# Patient Record
Sex: Male | Born: 1970 | Race: White | Hispanic: Refuse to answer | Marital: Married | Smoking: Never smoker
Health system: Southern US, Community
[De-identification: ages and names within clinical notes are randomized; demographics above are authoritative.]

## PROBLEM LIST (undated history)

## (undated) DIAGNOSIS — K8020 Calculus of gallbladder without cholecystitis without obstruction: Secondary | ICD-10-CM

## (undated) DIAGNOSIS — Z789 Other specified health status: Secondary | ICD-10-CM

## (undated) HISTORY — PX: VASECTOMY: SHX75

---

## 2015-02-08 ENCOUNTER — Ambulatory Visit (INDEPENDENT_AMBULATORY_CARE_PROVIDER_SITE_OTHER): Payer: BC Managed Care – PPO | Admitting: Physician Assistant

## 2015-02-08 VITALS — BP 128/86 | HR 50 | Temp 98.5°F | Resp 16 | Ht 65.75 in | Wt 164.0 lb

## 2015-02-08 DIAGNOSIS — Z23 Encounter for immunization: Secondary | ICD-10-CM

## 2015-02-08 DIAGNOSIS — H01116 Allergic dermatitis of left eye, unspecified eyelid: Secondary | ICD-10-CM | POA: Diagnosis not present

## 2015-02-08 NOTE — Progress Notes (Signed)
[facility] [address], [place] [id number] [phone]  Date:  02/08/2015   Name:  Steven Miles   DOB:  February 24, 1971   MRN:  [id number]  PCP:  Virgilio BellingWEBER,SARAH, PA-C    Chief Complaint: Belepharitis and Rash   History of Present Illness:  This is a 44 y.o. male who is presenting with left upper eyelid swelling x 3 days. Got worse last night but seems to be a little better today. The eyelid is swollen, red and itchy. No change in vision, no eyeball pain, irritation or redness, no photophobia, no drainage. Prior to symptoms beginning he was doing a lot of work outside and in [employer] under home. He does not have any known allergies. His wife put a small amount of neosporin on upper lid last night, otherwise he has not tried anything else for his symptoms.  Review of Systems:  Review of Systems See HPI  There are no active problems to display for this patient.   Prior to Admission medications   Not on File    No Known Allergies  History reviewed. No pertinent past surgical history.  Social History  Substance Use Topics  . Smoking status: Never Smoker   . Smokeless tobacco: None  . Alcohol Use: No    Family History  Problem Relation Age of Onset  . Diabetes Father   . Hyperlipidemia Brother     Medication list has been reviewed and updated.  Physical Examination:  Physical Exam  Constitutional: He is oriented to person, place, and time. He appears well-developed and well-nourished. No distress.  HENT:  Head: Normocephalic and atraumatic.  Right Ear: Hearing normal.  Left Ear: Hearing normal.  Nose: Nose normal.  Mouth/Throat: Uvula is midline, oropharynx is clear and moist and mucous membranes are normal.  Eyes: Conjunctivae and EOM are normal. Pupils are equal, round, and reactive to light. Right eye exhibits no discharge. Left eye exhibits no discharge. No scleral icterus.  Left upper lid puffy and erythematous, appears slightly dry at  medial lid. No skin crusting or flaking. No discharge. Not tender.  Pulmonary/Chest: Effort normal. No respiratory distress.  Musculoskeletal: Normal range of motion.  Lymphadenopathy:    He has no cervical adenopathy.  Neurological: He is alert and oriented to person, place, and time.  Skin: Skin is warm, dry and intact.  Psychiatric: He has a normal mood and affect. His speech is normal and behavior is normal. Thought content normal.   BP 128/86 mmHg  Pulse 50  Temp(Src) 98.5 F (36.9 C) (Oral)  Resp 16  Ht 5' 5.75" (1.67 m)  Wt 164 lb (74.39 kg)  BMI 26.67 kg/m2  SpO2 98%   Visual Acuity Screening   Right eye Left eye Both eyes  Without correction: 20/10 20/10 20/10   With correction:      Assessment and Plan:  1. Eyelid dermatitis, allergic/contact, left Suspect contact dermatitis of left lid d/t pruritis and lack of drainage, red eye, pain, eyeball irritation. Less likely blepharitis. He will treat with warm compresses and daily zyrtec. Return in 1 week if sx not improved or at any time if sx worsen.  2. Needs flu shot - Flu Vaccine QUAD 36+ mos IM   Roswell MinersNicole V. Dyke BrackettBush, PA-C, MHS [facility]  02/08/2015

## 2015-02-08 NOTE — Patient Instructions (Signed)
This is likely an allergic reaction. It could also be inflammation of the eyelid called blepharitis. To treat, apply warm compresses and take zyrtec daily until resolved. Avoid putting any ointments on eye. If your actual eyeball gets irritated, you may put rewetting or lubricating eyedrops in. Return in 1 week if your symptoms do not improve or at any time if symptoms worsen.

## 2015-11-18 ENCOUNTER — Ambulatory Visit (INDEPENDENT_AMBULATORY_CARE_PROVIDER_SITE_OTHER): Payer: BC Managed Care – PPO | Admitting: Family Medicine

## 2015-11-18 VITALS — BP 118/76 | HR 52 | Temp 97.7°F | Resp 18 | Ht 65.75 in | Wt 167.8 lb

## 2015-11-18 DIAGNOSIS — Z0000 Encounter for general adult medical examination without abnormal findings: Secondary | ICD-10-CM | POA: Diagnosis not present

## 2015-11-18 LAB — CBC
HEMATOCRIT: 45.1 % (ref 38.5–50.0)
HEMOGLOBIN: 15.5 g/dL (ref 13.2–17.1)
MCH: 30.7 pg (ref 27.0–33.0)
MCHC: 34.4 g/dL (ref 32.0–36.0)
MCV: 89.3 fL (ref 80.0–100.0)
MPV: 9.4 fL (ref 7.5–12.5)
Platelets: 270 10*3/uL (ref 140–400)
RBC: 5.05 MIL/uL (ref 4.20–5.80)
RDW: 13.6 % (ref 11.0–15.0)
WBC: 5.8 10*3/uL (ref 3.8–10.8)

## 2015-11-18 LAB — LIPID PANEL
CHOL/HDL RATIO: 3.6 ratio (ref <=5.0)
Cholesterol: 166 mg/dL (ref 125–200)
HDL: 46 mg/dL (ref >=40)
LDL CALC: 89 mg/dL (ref <130)
Triglycerides: 157 mg/dL — ABNORMAL HIGH (ref <150)
VLDL: 31 mg/dL — AB (ref <30)

## 2015-11-18 LAB — COMPREHENSIVE METABOLIC PANEL
ALT: 31 U/L (ref 9–46)
AST: 26 U/L (ref 10–40)
Albumin: 4.5 g/dL (ref 3.6–5.1)
Alkaline Phosphatase: 78 U/L (ref 40–115)
BUN: 18 mg/dL (ref 7–25)
CALCIUM: 9.4 mg/dL (ref 8.6–10.3)
CO2: 26 mmol/L (ref 20–31)
Chloride: 106 mmol/L (ref 98–110)
Creat: 1.16 mg/dL (ref 0.60–1.35)
GLUCOSE: 98 mg/dL (ref 65–99)
POTASSIUM: 4.2 mmol/L (ref 3.5–5.3)
Sodium: 142 mmol/L (ref 135–146)
Total Bilirubin: 0.6 mg/dL (ref 0.2–1.2)
Total Protein: 7.4 g/dL (ref 6.1–8.1)

## 2015-11-18 NOTE — Progress Notes (Signed)
Subjective:    Steven BrunsKevin Miles is a 45 y.o. 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  [place] and his church [place].  He lives a very active lifestyle.  He previously competed regularly in traithlons including distances of up to half-ironman.  This eventually did not become fun for him anymore, and now he works out and runs and exercises with his 44 year old daughter 5 days a week.    States he eats red meat.  Drinks "lots" of sweet tea.  Otherwise eats healthy diet.    Has no real past medical history to speak of.  No surgeries.  No hospitalizations.  Father with Type 2 diabetes, but otherwise denies any family medical history.  The patient denies fever, unusual weight change, decreased hearing, chest pain, palpitations, pre-syncopal or syncopal episodes, dyspnea on exertion, prolonged cough, hemoptysis, change in bowel habits, melena, hematochezia, severe indigestion/heartburn, nausea/vomiting/abdominal pain, genital sores, muscle weakness, difficulty walking, abnormal bleeding, or enlarged lymph nodes.     Objective:   Physical Exam BP 118/76   Pulse (!) 52   Temp 97.7 F (36.5 C) (Oral)   Resp 18   Ht 5' 5.75" (1.67 m)   Wt 167 lb 12.8 oz (76.1 kg)   SpO2 100%   BMI 27.29 kg/m  HEENT:  [place]/AT.  EOMI, PERRL.  MMM, tonsils non-erythematous, non-edematous.  External ears WNL, Bilateral TM's normal without retraction, redness or bulging.  Neck: Supple Lungs: Clear throughout Heart: Regular rate and rhythm without murmur. Within normal limits. Abdomen: Soft/NT/ND.  No organomegaly.  Musculoskeletal and spine exam: Within normal limits.  Strength is 5/5 Neuro:  Alert and oriented to person, place, and date.  CN II-XII intact.  No focal deficits noted.   Skin: Within normal limits Psych:  Not depressed or anxious appearing.  Linear and coherent thought process as evidenced by speech pattern. Smiles spontaneously.     Impression/Plan: 1.  Complete physical: - doing very well.   - no concerns.  - FU in 1 year.  Sooner if issues arise.   - we discussed DRE and PSA testing.  Patient declined either of these today.  No family history of prostate disease/cancer.

## 2015-11-18 NOTE — Patient Instructions (Addendum)
It was great to meet you today.  Tell your wife you're doing fantastic  Keep up what you have been doing as far as exercise.  We're checking basic labwork today and will let you know the results.    IF you received an x-ray today, you will receive an invoice from [facility]. Please contact [facility] at [phone] with questions or concerns regarding your invoice.   IF you received labwork today, you will receive an invoice from [facility]. Please contact [facility] at [phone] with questions or concerns regarding your invoice.   Our billing staff will not be able to assist you with questions regarding bills from these companies.  You will be contacted with the lab results as soon as they are available. The fastest way to get your results is to activate your My Chart account. Instructions are located on the last page of this paperwork. If you have not heard from us regarding the results in 2 weeks, please contact this office.

## 2015-11-19 ENCOUNTER — Encounter: Payer: Self-pay | Admitting: Family Medicine

## 2015-12-30 ENCOUNTER — Ambulatory Visit (INDEPENDENT_AMBULATORY_CARE_PROVIDER_SITE_OTHER): Payer: BC Managed Care – PPO | Admitting: Urgent Care

## 2015-12-30 ENCOUNTER — Encounter: Payer: Self-pay | Admitting: Physician Assistant

## 2015-12-30 VITALS — BP 129/88 | HR 60 | Ht 65.0 in | Wt 167.8 lb

## 2015-12-30 DIAGNOSIS — S0101XA Laceration without foreign body of scalp, initial encounter: Secondary | ICD-10-CM | POA: Diagnosis not present

## 2015-12-30 DIAGNOSIS — R51 Headache: Secondary | ICD-10-CM

## 2015-12-30 DIAGNOSIS — R519 Headache, unspecified: Secondary | ICD-10-CM

## 2015-12-30 DIAGNOSIS — S0990XA Unspecified injury of head, initial encounter: Secondary | ICD-10-CM

## 2015-12-30 MED ORDER — ALPRAZOLAM 0.25 MG PO TABS
0.5000 mg | ORAL_TABLET | Freq: Once | ORAL | Status: AC
  Administered 2015-12-30: 0.5 mg via ORAL

## 2015-12-30 MED ORDER — ACETAMINOPHEN 500 MG PO TABS
1000.0000 mg | ORAL_TABLET | Freq: Once | ORAL | Status: AC
  Administered 2015-12-30: 1000 mg via ORAL

## 2015-12-30 NOTE — Progress Notes (Addendum)
° °  By signing my name below, I, Steven Miles, attest that this documentation has been prepared under the direction and in the presence of Wallis BambergMario Mani, [place]-C.  Electronically Signed: Arvilla MarketMesha Miles, Medical Scribe. 12/30/15. 11:54 AM.  MRN: [id number] DOB: April 01, 1970  Subjective:   Barkley BrunsKevin Lora is a 45 y.o. male presenting for chief complaint of head laceration onset today. Pt was fishing when a loose tree limb fell on his head. Denies loss of consciousness, weakness, numbness or tingling, confusion. Admits a headache, feeling queezy. He bled profusely and used a dirty rag to apply pressure. Last Tdap was 07/08/2009.  [name] is not currently taking any medications.  Also has No Known Allergies.  [name]  has no past medical history on file. Also  has no past surgical history on file.  Objective:   Vitals: BP 129/88 (BP Location: Right Arm, Patient Position: Sitting, Cuff Size: Normal)    Pulse 60    Ht 5\' 5"  (1.651 m)    Wt 167 lb 12.8 oz (76.1 kg)    BMI 27.92 kg/m   Physical Exam  Constitutional: He is oriented to person, place, and time. He appears well-developed and well-nourished.  HENT:  TM's intact bilaterally, no effusions or erythema. Nasal turbinates pink and moist, nasal passages patent. No sinus tenderness. Oropharynx clear, mucous membranes moist, dentition in good repair.  Eyes: EOM are normal. Pupils are equal, round, and reactive to light.  Neck: Normal range of motion. Neck supple.  Cardiovascular: Normal rate.   Pulmonary/Chest: Effort normal.  Neurological: He is alert and oriented to person, place, and time. He has normal strength and normal reflexes. No cranial nerve deficit. Coordination normal.  Strength intact- 5/5  Skin: Skin is warm and dry.   PROCEDURE NOTE: laceration repair Verbal consent obtained from patient.  Local anesthesia with 5cc Lidocaine 2% with epinephrine.  Wound explored for tendon, ligament damage. Wound scrubbed with soap and water and rinsed.  Wound closed with #16 4-0 Prolene (4 horizontal mattress, 12 simple interrupted) sutures.  Wound cleansed and dressed.   Assessment and Plan :   1. Laceration of scalp, initial encounter 2. Pain of scalp 3. Injury of head, initial encounter - Wound care reviewed, patient to rtc in 8-10 days, counseled on signs and symptom of post-concussive syndrome. Physical exam findings are very reassuring.  Wallis BambergMario Mani, PA-C [facility] [phone] 12/30/2015 11:51 AM

## 2015-12-30 NOTE — Progress Notes (Signed)
Pt was out fishing today when a tree limb fell directly to right scalp area.  Approximately 10cm laceration with min bleeding C/o slight headache and nausea vss

## 2015-12-30 NOTE — Patient Instructions (Addendum)
For pain, you may take ibuprofen 400mg  and alternate with Tylenol 500mg  every 6 hours.   Laceration Care, Adult A laceration is a cut that goes through all of the layers of the skin and into the tissue that is right under the skin. Some lacerations heal on their own. Others need to be closed with stitches (sutures), staples, skin adhesive strips, or skin glue. Proper laceration care minimizes the risk of infection and helps the laceration to heal better. HOW TO CARE FOR YOUR LACERATION If sutures or staples were used:  Keep the wound clean and dry.  If you were given a bandage (dressing), you should change it at least one time per day or as told by your health care provider. You should also change it if it becomes wet or dirty.  Keep the wound completely dry for the first 24 hours or as told by your health care provider. After that time, you may shower or bathe. However, make sure that the wound is not soaked in water until after the sutures or staples have been removed.  Clean the wound one time each day or as told by your health care provider:  Wash the wound with soap and water.  Rinse the wound with water to remove all soap.  Pat the wound dry with a clean towel. Do not rub the wound.  After cleaning the wound, apply a thin layer of antibiotic ointmentas told by your health care provider. This will help to prevent infection and keep the dressing from sticking to the wound.  Have the sutures or staples removed as told by your health care provider. If skin adhesive strips were used:  Keep the wound clean and dry.  If you were given a bandage (dressing), you should change it at least one time per day or as told by your health care provider. You should also change it if it becomes dirty or wet.  Do not get the skin adhesive strips wet. You may shower or bathe, but be careful to keep the wound dry.  If the wound gets wet, pat it dry with a clean towel. Do not rub the wound.  Skin  adhesive strips fall off on their own. You may trim the strips as the wound heals. Do not remove skin adhesive strips that are still stuck to the wound. They will fall off in time. If skin glue was used:  Try to keep the wound dry, but you may briefly wet it in the shower or bath. Do not soak the wound in water, such as by swimming.  After you have showered or bathed, gently pat the wound dry with a clean towel. Do not rub the wound.  Do not do any activities that will make you sweat heavily until the skin glue has fallen off on its own.  Do not apply liquid, cream, or ointment medicine to the wound while the skin glue is in place. Using those may loosen the film before the wound has healed.  If you were given a bandage (dressing), you should change it at least one time per day or as told by your health care provider. You should also change it if it becomes dirty or wet.  If a dressing is placed over the wound, be careful not to apply tape directly over the skin glue. Doing that may cause the glue to be pulled off before the wound has healed.  Do not pick at the glue. The skin glue usually remains in place  for 5-10 days, then it falls off of the skin. General Instructions  Take over-the-counter and prescription medicines only as told by your health care provider.  If you were prescribed an antibiotic medicine or ointment, take or apply it as told by your doctor. Do not stop using it even if your condition improves.  To help prevent scarring, make sure to cover your wound with sunscreen whenever you are outside after stitches are removed, after adhesive strips are removed, or when glue remains in place and the wound is healed. Make sure to wear a sunscreen of at least 30 SPF.  Do not scratch or pick at the wound.  Keep all follow-up visits as told by your health care provider. This is important.  Check your wound every day for signs of infection. Watch for:  Redness, swelling, or  pain.  Fluid, blood, or pus.  Raise (elevate) the injured area above the level of your heart while you are sitting or lying down, if possible. SEEK MEDICAL CARE IF:  You received a tetanus shot and you have swelling, severe pain, redness, or bleeding at the injection site.  You have a fever.  A wound that was closed breaks open.  You notice a bad smell coming from your wound or your dressing.  You notice something coming out of the wound, such as wood or glass.  Your pain is not controlled with medicine.  You have increased redness, swelling, or pain at the site of your wound.  You have fluid, blood, or pus coming from your wound.  You notice a change in the color of your skin near your wound.  You need to change the dressing frequently due to fluid, blood, or pus draining from the wound.  You develop a new rash.  You develop numbness around the wound. SEEK IMMEDIATE MEDICAL CARE IF:  You develop severe swelling around the wound.  Your pain suddenly increases and is severe.  You develop painful lumps near the wound or on skin that is anywhere on your body.  You have a red streak going away from your wound.  The wound is on your hand or foot and you cannot properly move a finger or toe.  The wound is on your hand or foot and you notice that your fingers or toes look pale or bluish.   This information is not intended to replace advice given to you by your health care provider. Make sure you discuss any questions you have with your health care provider.   Document Released: 03/13/2005 Document Revised: 07/28/2014 Document Reviewed: 03/09/2014 Elsevier Interactive Patient Education [employer]2016 Elsevier Inc.

## 2016-01-10 ENCOUNTER — Ambulatory Visit (INDEPENDENT_AMBULATORY_CARE_PROVIDER_SITE_OTHER): Payer: BC Managed Care – PPO | Admitting: Physician Assistant

## 2016-01-10 DIAGNOSIS — S0101XD Laceration without foreign body of scalp, subsequent encounter: Secondary | ICD-10-CM

## 2016-01-10 NOTE — Patient Instructions (Addendum)
Keep away from large branches while fishing today.   Return if you have any fever, bleeding, pus drainage.   Thank you for letting me participate in your health and well being.      IF you received an x-ray today, you will receive an invoice from [facility]. Please contact [facility] at [phone] with questions or concerns regarding your invoice.   IF you received labwork today, you will receive an invoice from [facility]. Please contact [facility] at [phone] with questions or concerns regarding your invoice.   Our billing staff will not be able to assist you with questions regarding bills from these companies.  You will be contacted with the lab results as soon as they are available. The fastest way to get your results is to activate your My Chart account. Instructions are located on the last page of this paperwork. If you have not heard from us regarding the results in 2 weeks, please contact this office.

## 2016-01-10 NOTE — Progress Notes (Signed)
   Patient: Steven Miles [id number]  Subjective: Steven Miles is returning for suture removal. Patient was initially seen 12/30/15 and had 17 sutures placed. Denies fever, drainage of pus or blood, wound dehiscence, edema, pain.   Objective: Physical Exam  Constitutional: He is oriented to person, place, and time and well-developed, well-nourished, and in no distress.  HENT:  Head: Normocephalic and atraumatic.    Eyes: Conjunctivae are normal.  Neck: Normal range of motion.  Pulmonary/Chest: Effort normal.  Musculoskeletal: Normal range of motion.  Neurological: He is alert and oriented to person, place, and time. Gait normal.  Skin: Skin is warm and dry.  Psychiatric: Affect normal.  Vitals reviewed.  # 17 sutures removed without incident. Patient tolerated this well.  Assessment and Plan: 1. Laceration of scalp, subsequent encounter Well-healed wound. Anticipatory guidance provided. Return to clinic as needed.  Benjiman CoreBrittany Aspyn Warnke, PA-C  [facility] 01/10/2016 8:54 AM

## 2016-06-30 ENCOUNTER — Ambulatory Visit (INDEPENDENT_AMBULATORY_CARE_PROVIDER_SITE_OTHER): Payer: BC Managed Care – PPO | Admitting: Physician Assistant

## 2016-06-30 VITALS — BP 132/86 | HR 84 | Temp 98.3°F | Resp 18 | Ht 65.0 in | Wt 170.0 lb

## 2016-06-30 DIAGNOSIS — L282 Other prurigo: Secondary | ICD-10-CM | POA: Diagnosis not present

## 2016-06-30 DIAGNOSIS — W57XXXA Bitten or stung by nonvenomous insect and other nonvenomous arthropods, initial encounter: Secondary | ICD-10-CM

## 2016-06-30 MED ORDER — CLOBETASOL PROPIONATE 0.05 % EX OINT: 1.0000 "application " | TOPICAL_OINTMENT | Freq: Two times a day (BID) | CUTANEOUS | 0 refills | Status: DC

## 2016-06-30 MED ORDER — DOXYCYCLINE HYCLATE 100 MG PO CAPS: 100.0000 mg | ORAL_CAPSULE | Freq: Two times a day (BID) | ORAL | 0 refills | Status: AC

## 2016-06-30 NOTE — Patient Instructions (Signed)
     IF you received an x-ray today, you will receive an invoice from [facility]. Please contact [facility] at [phone] with questions or concerns regarding your invoice.   IF you received labwork today, you will receive an invoice from [facility]. Please contact [facility] at [phone] with questions or concerns regarding your invoice.   Our billing staff will not be able to assist you with questions regarding bills from these companies.  You will be contacted with the lab results as soon as they are available. The fastest way to get your results is to activate your My Chart account. Instructions are located on the last page of this paperwork. If you have not heard from us regarding the results in 2 weeks, please contact this office.     

## 2016-06-30 NOTE — Progress Notes (Signed)
  07/03/2016 9:52 AM   DOB: 29-Nov-1970 / MRN: [id number]  SUBJECTIVE:  Steven Miles is a 46 y.o. male presenting for a tick bite that he noticed on his right inguinal canal about 2 days ago. Denies fever, chills, significant rashes, arthralgia.  He has the tick with him today. Tells me his TD is up to date.   He has No Known Allergies.    Review of Systems  Constitutional: Negative for chills, diaphoresis and fever.  Eyes: Negative.   Respiratory: Negative for cough, hemoptysis, sputum production, shortness of breath and wheezing.   Cardiovascular: Negative for chest pain, orthopnea and leg swelling.  Gastrointestinal: Negative for abdominal pain, blood in stool, constipation, diarrhea, heartburn, melena, nausea and vomiting.  Genitourinary: Negative for flank pain.  Skin: Negative for rash.  Neurological: Negative for dizziness, sensory change, speech change, focal weakness and headaches.    The problem list and medications were reviewed and updated by myself where necessary and exist elsewhere in the encounter.   OBJECTIVE:  BP 132/86   Pulse 84   Temp 98.3 F (36.8 C) (Oral)   Resp 18   Ht  (1.651 m)   Wt 170 lb (77.1 kg)   SpO2 94%   BMI 28.29 kg/m   Physical Exam  Constitutional: He appears well-developed. He is active and cooperative.  Non-toxic appearance.  Cardiovascular: Normal rate, regular rhythm, S1 normal, S2 normal, normal heart sounds, intact distal pulses and normal pulses.  Exam reveals no gallop and no friction rub.   No murmur heard. Pulmonary/Chest: Effort normal. No tachypnea. He has no rales.  Abdominal: He exhibits no distension.  Musculoskeletal: He exhibits no edema.  Neurological: He is alert.  Skin: Skin is warm and dry. He is not diaphoretic. No pallor.     Vitals reviewed.    No results found for this or any previous visit (from the past 72 hour(s)).  No results found.  ASSESSMENT AND PLAN:  Steven Miles was seen today for tick  bite.  Diagnoses and all orders for this visit:  Pruritic rash: See problem 2.  -     clobetasol ointment (TEMOVATE) 0.05 %; Apply 1 application topically 2 (two) times daily.  Tick bite, initial encounter: Low risk as this appears to be an [place] dog tic and he has no concerning rash. He will fill doxy per the sig and RTC as needed.  -     doxycycline (VIBRAMYCIN) 100 MG capsule; Take 1 capsule (100 mg total) by mouth 2 (two) times daily. Fill only if fever or chills or muscle aches or joint pain or rash.    The patient is advised to call or return to clinic if he does not see an improvement in symptoms, or to seek the care of the closest emergency department if he worsens with the above plan.   Deliah Boston, MHS, PA-C [facility] 07/03/2016 9:52 AM

## 2016-07-13 ENCOUNTER — Ambulatory Visit (INDEPENDENT_AMBULATORY_CARE_PROVIDER_SITE_OTHER): Payer: BC Managed Care – PPO | Admitting: Physician Assistant

## 2016-07-13 VITALS — BP 149/89 | HR 59 | Temp 97.8°F | Resp 18 | Ht 65.0 in | Wt 173.4 lb

## 2016-07-13 DIAGNOSIS — S30861A Insect bite (nonvenomous) of abdominal wall, initial encounter: Secondary | ICD-10-CM | POA: Diagnosis not present

## 2016-07-13 DIAGNOSIS — W57XXXA Bitten or stung by nonvenomous insect and other nonvenomous arthropods, initial encounter: Secondary | ICD-10-CM

## 2016-07-13 NOTE — Patient Instructions (Addendum)
If you have fever, chills, muscles aches, sweats, or a rash in the next 48 hours to the next five days then come back here but go ahead and start doxycycline.     IF you received an x-ray today, you will receive an invoice from [facility]. Please contact [facility] at [phone] with questions or concerns regarding your invoice.   IF you received labwork today, you will receive an invoice from [facility]. Please contact [facility] at [phone] with questions or concerns regarding your invoice.   Our billing staff will not be able to assist you with questions regarding bills from these companies.  You will be contacted with the lab results as soon as they are available. The fastest way to get your results is to activate your My Chart account. Instructions are located on the last page of this paperwork. If you have not heard from [place] regarding the results in 2 weeks, please contact this office.

## 2016-07-13 NOTE — Progress Notes (Signed)
  07/13/2016 4:17 PM   DOB: [birth year] / MRN: [id number]  SUBJECTIVE:  Steven Miles is a 46 y.o. male presenting for a new tic bite to the left inguinal canal.  The tic is still attache today and he wants me to pull it off.  He   He has No Known Allergies.   He  has no past medical history on file.    He  reports that he has never smoked. He has never used smokeless tobacco. He reports that he does not drink alcohol or use drugs. He  has no sexual activity history on file. The patient  has no past surgical history on file.  His family history includes Diabetes in his father; Hyperlipidemia in his brother.  Review of Systems  Constitutional: Negative for fever.  Gastrointestinal: Negative for nausea.  Musculoskeletal: Negative for myalgias.  Skin: Negative for rash.  Neurological: Negative for dizziness.    The problem list and medications were reviewed and updated by myself where necessary and exist elsewhere in the encounter.   OBJECTIVE:  BP (!) 149/89   Pulse (!) 59   Temp 97.8 F (36.6 C) (Oral)   Resp 18   Ht  (1.651 m)   Wt 173 lb 6.4 oz (78.7 kg)   SpO2 96%   BMI 28.86 kg/m   Physical Exam  Constitutional: He is active and cooperative.  Cardiovascular: Normal rate.   Pulmonary/Chest: Effort normal. No tachypnea.  Abdominal:    Neurological: He is alert.  Skin: Skin is warm.  Vitals reviewed.   No results found for this or any previous visit (from the past 72 hour(s)).  No results found.  ASSESSMENT AND PLAN:  Steven Miles was seen today for insect bite.  Diagnoses and all orders for this visit:  Tick bite of abdomen, initial encounter: Removed in the office.  Doxy on file at pharmacy. Anticipatory guidance provided via avs.     The patient is advised to call or return to clinic if he does not see an improvement in symptoms, or to seek the care of the closest emergency department if he worsens with the above plan.   Deliah Boston, MHS, PA-C [facility] 07/13/2016 4:17 PM

## 2016-11-28 ENCOUNTER — Encounter: Payer: Self-pay | Admitting: Physician Assistant

## 2016-11-28 ENCOUNTER — Ambulatory Visit (INDEPENDENT_AMBULATORY_CARE_PROVIDER_SITE_OTHER): Payer: BC Managed Care – PPO | Admitting: Physician Assistant

## 2016-11-28 VITALS — BP 133/89 | HR 50 | Temp 97.4°F | Resp 16 | Ht 65.75 in | Wt 162.8 lb

## 2016-11-28 DIAGNOSIS — Z136 Encounter for screening for cardiovascular disorders: Secondary | ICD-10-CM | POA: Diagnosis not present

## 2016-11-28 DIAGNOSIS — Z1321 Encounter for screening for nutritional disorder: Secondary | ICD-10-CM

## 2016-11-28 DIAGNOSIS — Z23 Encounter for immunization: Secondary | ICD-10-CM | POA: Diagnosis not present

## 2016-11-28 DIAGNOSIS — Z130 Encounter for screening for diseases of the blood and blood-forming organs and certain disorders involving the immune mechanism: Secondary | ICD-10-CM | POA: Diagnosis not present

## 2016-11-28 DIAGNOSIS — Z0000 Encounter for general adult medical examination without abnormal findings: Secondary | ICD-10-CM | POA: Diagnosis not present

## 2016-11-28 DIAGNOSIS — Z13228 Encounter for screening for other metabolic disorders: Secondary | ICD-10-CM

## 2016-11-28 DIAGNOSIS — Z1329 Encounter for screening for other suspected endocrine disorder: Secondary | ICD-10-CM

## 2016-11-28 NOTE — Progress Notes (Signed)
11/28/2016 9:24 AM   DOB: Mar 21, 1971 / MRN: [id number]  SUBJECTIVE:  Steven Miles is a 46 y.o. male presenting for annual exam.  He feels well today. He gets the flu shot every year. States he has an abundance of energy.  Always gets his flu shot with his family.   The natural history of prostate cancer and ongoing controversy regarding screening and potential treatment outcomes of prostate cancer has been discussed with the patient. The meaning of a false positive PSA and a false negative PSA has been discussed. He indicates understanding of the limitations of this screening test and wishes not to proceed with screening PSA testing.   He has No Known Allergies.   He  has no past medical history on file.    He  reports that he has never smoked. He has never used smokeless tobacco. He reports that he does not drink alcohol or use drugs. He  has no sexual activity history on file. The patient  has no past surgical history on file.  His family history includes Diabetes in his father; Hyperlipidemia in his brother.  Review of Systems  Constitutional: Negative for chills, diaphoresis and fever.  Eyes: Negative.   Respiratory: Negative for cough, hemoptysis, sputum production, shortness of breath and wheezing.   Cardiovascular: Negative for chest pain, orthopnea and leg swelling.  Gastrointestinal: Negative for abdominal pain, blood in stool, constipation, diarrhea, heartburn, melena, nausea and vomiting.  Genitourinary: Negative for dysuria, flank pain, frequency, hematuria and urgency.  Skin: Negative for rash.  Neurological: Negative for dizziness, sensory change, speech change, focal weakness and headaches.    The problem list and medications were reviewed and updated by myself where necessary and exist elsewhere in the encounter.   OBJECTIVE:  BP 133/89 (BP Location: Right Arm, Patient Position: Sitting, Cuff Size: Normal)   Pulse (!) 50   Temp (!) 97.4 F (36.3 C) (Oral)   Resp  16   Ht 5' 5.75" (1.67 m)   Wt 162 lb 12.8 oz (73.8 kg)   SpO2 97%   BMI 26.48 kg/m    Pulse Readings from Last 3 Encounters:  11/28/16 (!) 50  07/13/16 (!) 59  06/30/16 84     Physical Exam  Constitutional: He is oriented to person, place, and time. He appears well-developed. He is active and cooperative.  Non-toxic appearance.  HENT:  Right Ear: Hearing, tympanic membrane, external ear and ear canal normal.  Left Ear: Hearing, tympanic membrane, external ear and ear canal normal.  Nose: Nose normal. Right sinus exhibits no maxillary sinus tenderness and no frontal sinus tenderness. Left sinus exhibits no maxillary sinus tenderness and no frontal sinus tenderness.  Mouth/Throat: Uvula is midline, oropharynx is clear and moist and mucous membranes are normal. No oropharyngeal exudate, posterior oropharyngeal edema or tonsillar abscesses.  Eyes: Pupils are equal, round, and reactive to light. Conjunctivae and EOM are normal.  Cardiovascular: Normal rate, regular rhythm, S1 normal, S2 normal, normal heart sounds, intact distal pulses and normal pulses.  Exam reveals no gallop and no friction rub.   No murmur heard. Pulmonary/Chest: Effort normal. No stridor. No tachypnea. No respiratory distress. He has no wheezes. He has no rales.  Abdominal: Soft. Normal appearance and bowel sounds are normal. He exhibits no distension and no mass. There is no tenderness. There is no rigidity, no rebound, no guarding and no CVA tenderness. No hernia.  Musculoskeletal: He exhibits no edema.  Lymphadenopathy:       Head (  right side): No submandibular and no tonsillar adenopathy present.       Head (left side): No submandibular and no tonsillar adenopathy present.    He has no cervical adenopathy.  Neurological: He is alert and oriented to person, place, and time. He has normal strength and normal reflexes. He is not disoriented. No cranial nerve deficit or sensory deficit. He exhibits normal muscle tone.  Coordination and gait normal.  Skin: Skin is warm and dry. He is not diaphoretic. No pallor.  Psychiatric: His behavior is normal.  Vitals reviewed.  No results found for this or any previous visit (from the past 72 hour(s)).  No results found.  ASSESSMENT AND PLAN:  [name] was seen today for annual exam.  Diagnoses and all orders for this visit:  Annual physical exam: EKG shows sinus brady and otherwise normal.   Screening for endocrine, nutritional, metabolic and immunity disorder -     CBC -     Lipid panel -     TSH -     Hemoglobin A1c -     HIV antibody -     Hepatitis C antibody -     CMP and Liver  Needs flu shot -     Flu Vaccine QUAD 36+ mos IM  Screening for cardiovascular condition -     EKG 12-Lead    The patient is advised to call or return to clinic if he does not see an improvement in symptoms, or to seek the care of the closest emergency department if he worsens with the above plan.   Deliah Boston, MHS, PA-C [facility] 11/28/2016 9:24 AM

## 2016-11-29 LAB — TSH: TSH: 3.17 u[IU]/mL (ref 0.450–4.500)

## 2016-11-29 LAB — CMP AND LIVER
ALT: 17 IU/L (ref 0–44)
AST: 24 IU/L (ref 0–40)
Albumin: 4.7 g/dL (ref 3.5–5.5)
Alkaline Phosphatase: 85 IU/L (ref 39–117)
BUN: 17 mg/dL (ref 6–24)
Bilirubin Total: 0.6 mg/dL (ref 0.0–1.2)
Bilirubin, Direct: 0.16 mg/dL (ref 0.00–0.40)
CALCIUM: 9.2 mg/dL (ref 8.7–10.2)
CO2: 23 mmol/L (ref 20–29)
CREATININE: 1.22 mg/dL (ref 0.76–1.27)
Chloride: 101 mmol/L (ref 96–106)
GFR calc Af Amer: 82 mL/min/{1.73_m2} (ref >59)
GFR calc non Af Amer: 71 mL/min/{1.73_m2} (ref >59)
GLUCOSE: 89 mg/dL (ref 65–99)
Potassium: 4.1 mmol/L (ref 3.5–5.2)
SODIUM: 140 mmol/L (ref 134–144)
TOTAL PROTEIN: 7.6 g/dL (ref 6.0–8.5)

## 2016-11-29 LAB — CBC
HEMATOCRIT: 45 % (ref 37.5–51.0)
Hemoglobin: 15.1 g/dL (ref 13.0–17.7)
MCH: 30 pg (ref 26.6–33.0)
MCHC: 33.6 g/dL (ref 31.5–35.7)
MCV: 89 fL (ref 79–97)
Platelets: 291 10*3/uL (ref 150–379)
RBC: 5.04 x10E6/uL (ref 4.14–5.80)
RDW: 13.5 % (ref 12.3–15.4)
WBC: 6.1 10*3/uL (ref 3.4–10.8)

## 2016-11-29 LAB — LIPID PANEL
CHOL/HDL RATIO: 3.7 ratio (ref 0.0–5.0)
Cholesterol, Total: 136 mg/dL (ref 100–199)
HDL: 37 mg/dL — AB (ref >39)
LDL Calculated: 82 mg/dL (ref 0–99)
TRIGLYCERIDES: 83 mg/dL (ref 0–149)
VLDL CHOLESTEROL CAL: 17 mg/dL (ref 5–40)

## 2016-11-29 LAB — HEMOGLOBIN A1C
Est. average glucose Bld gHb Est-mCnc: 111 mg/dL
Hgb A1c MFr Bld: 5.5 % (ref 4.8–5.6)

## 2016-11-29 LAB — HEPATITIS C ANTIBODY

## 2016-11-29 LAB — HIV ANTIBODY (ROUTINE TESTING W REFLEX): HIV Screen 4th Generation wRfx: NONREACTIVE

## 2017-09-24 ENCOUNTER — Telehealth: Payer: Self-pay | Admitting: Family Medicine

## 2017-09-24 ENCOUNTER — Ambulatory Visit: Payer: BC Managed Care – PPO | Admitting: Family Medicine

## 2017-09-24 ENCOUNTER — Ambulatory Visit (HOSPITAL_COMMUNITY)
Admission: RE | Admit: 2017-09-24 | Discharge: 2017-09-24 | Disposition: A | Discharge status: Home / Self Care | Payer: BC Managed Care – PPO | Source: Ambulatory Visit | Attending: Family Medicine | Admitting: Family Medicine

## 2017-09-24 ENCOUNTER — Telehealth: Payer: Self-pay | Admitting: *Deleted

## 2017-09-24 ENCOUNTER — Encounter: Payer: Self-pay | Admitting: Family Medicine

## 2017-09-24 ENCOUNTER — Other Ambulatory Visit: Payer: Self-pay

## 2017-09-24 VITALS — BP 159/100 | HR 78 | Temp 98.0°F | Resp 17 | Ht 65.75 in | Wt 169.0 lb

## 2017-09-24 DIAGNOSIS — R1013 Epigastric pain: Secondary | ICD-10-CM | POA: Diagnosis present

## 2017-09-24 DIAGNOSIS — K8020 Calculus of gallbladder without cholecystitis without obstruction: Secondary | ICD-10-CM

## 2017-09-24 DIAGNOSIS — R1032 Left lower quadrant pain: Secondary | ICD-10-CM | POA: Diagnosis not present

## 2017-09-24 LAB — COMPREHENSIVE METABOLIC PANEL
A/G RATIO: 1.8 (ref 1.2–2.2)
ALBUMIN: 4.6 g/dL (ref 3.5–5.5)
ALT: 32 IU/L (ref 0–44)
AST: 24 IU/L (ref 0–40)
Alkaline Phosphatase: 86 IU/L (ref 39–117)
BILIRUBIN TOTAL: 0.2 mg/dL (ref 0.0–1.2)
BUN/Creatinine Ratio: 12 (ref 9–20)
BUN: 14 mg/dL (ref 6–24)
CALCIUM: 9.7 mg/dL (ref 8.7–10.2)
CHLORIDE: 102 mmol/L (ref 96–106)
CO2: 23 mmol/L (ref 20–29)
Creatinine, Ser: 1.14 mg/dL (ref 0.76–1.27)
GFR calc non Af Amer: 77 mL/min/{1.73_m2} (ref >59)
GFR, EST AFRICAN AMERICAN: 89 mL/min/{1.73_m2} (ref >59)
GLUCOSE: 132 mg/dL — AB (ref 65–99)
Globulin, Total: 2.6 g/dL (ref 1.5–4.5)
POTASSIUM: 4.8 mmol/L (ref 3.5–5.2)
Sodium: 135 mmol/L (ref 134–144)
TOTAL PROTEIN: 7.2 g/dL (ref 6.0–8.5)

## 2017-09-24 LAB — POCT URINALYSIS DIP (MANUAL ENTRY)
BILIRUBIN UA: NEGATIVE
BILIRUBIN UA: NEGATIVE mg/dL
Blood, UA: NEGATIVE
GLUCOSE UA: NEGATIVE mg/dL
LEUKOCYTES UA: NEGATIVE
Nitrite, UA: NEGATIVE
Protein Ur, POC: NEGATIVE mg/dL
Spec Grav, UA: 1.02 (ref 1.010–1.025)
Urobilinogen, UA: 0.2 E.U./dL
pH, UA: 5 (ref 5.0–8.0)

## 2017-09-24 LAB — LIPASE: LIPASE: 25 U/L (ref 13–78)

## 2017-09-24 LAB — CBC WITH DIFFERENTIAL/PLATELET
Basophils Absolute: 0 10*3/uL (ref 0.0–0.2)
Basos: 0 %
EOS (ABSOLUTE): 0.1 10*3/uL (ref 0.0–0.4)
EOS: 1 %
HEMOGLOBIN: 15.6 g/dL (ref 13.0–17.7)
Hematocrit: 43.9 % (ref 37.5–51.0)
Lymphocytes Absolute: 1.1 10*3/uL (ref 0.7–3.1)
Lymphs: 12 %
MCH: 31 pg (ref 26.6–33.0)
MCHC: 35.5 g/dL (ref 31.5–35.7)
MCV: 87 fL (ref 79–97)
MONOS ABS: 0.6 10*3/uL (ref 0.1–0.9)
Monocytes: 6 %
Neutrophils Absolute: 7.5 10*3/uL — ABNORMAL HIGH (ref 1.4–7.0)
Neutrophils: 81 %
Platelets: 293 10*3/uL (ref 150–450)
RBC: 5.04 x10E6/uL (ref 4.14–5.80)
RDW: 13.8 % (ref 12.3–15.4)
WBC: 9.3 10*3/uL (ref 3.4–10.8)

## 2017-09-24 LAB — POC MICROSCOPIC URINALYSIS (UMFC): Mucus: ABSENT

## 2017-09-24 MED ORDER — TRAMADOL HCL 50 MG PO TABS
50.0000 mg | ORAL_TABLET | Freq: Three times a day (TID) | ORAL | 0 refills | Status: DC | PRN
Start: 2017-09-24 — End: 2017-11-02

## 2017-09-24 MED ORDER — PANTOPRAZOLE SODIUM 40 MG PO TBEC
40.0000 mg | DELAYED_RELEASE_TABLET | Freq: Every day | ORAL | 3 refills | Status: AC
Start: 2017-09-24 — End: ?

## 2017-09-24 MED ORDER — SUCRALFATE 1 G PO TABS: 1.0000 g | ORAL_TABLET | Freq: Three times a day (TID) | ORAL | 0 refills | Status: DC

## 2017-09-24 NOTE — Patient Instructions (Addendum)
  [facility] at 1;45 for ultrasound.   Do eat or drink anything before appointment    IF you received an x-ray today, you will receive an invoice from [facility]. Please contact [facility] at [phone] with questions or concerns regarding your invoice.   IF you received labwork today, you will receive an invoice from [facility]. Please contact [facility] at [phone] with questions or concerns regarding your invoice.   Our billing staff will not be able to assist you with questions regarding bills from these companies.  You will be contacted with the lab results as soon as they are available. The fastest way to get your results is to activate your My Chart account. Instructions are located on the last page of this paperwork. If you have not heard from us regarding the results in 2 weeks, please contact this office.

## 2017-09-24 NOTE — Telephone Encounter (Signed)
Dr. Creta Levinstallings spoke with patient and advised of ultrasound results

## 2017-09-24 NOTE — Telephone Encounter (Signed)
Copied from CRM [id number]. Topic: General - Other >> Sep 24, 2017  2:31 PM Tamela OddiHarris, Brenda J wrote: Reason for CRM: Patient is requesting to know what the next step would be after his ultra sound today 09/24/17.  Please call patient to advise what he needs to do next.  CB# [phone].  >> Sep 24, 2017  2:39 PM Oneal GroutSebastian, Jennifer S wrote: Patient checking status, still waiting at hospital

## 2017-09-24 NOTE — Progress Notes (Signed)
Chief Complaint  Patient presents with  . Abdominal Pain    3 episode in the last 2 weeks, left side abdominal pain radiating into back.  Pt ate Ty food at midnight and then the pain started.  Pain 8/10    HPI   Patient reports that starting 2 weeks ago he would wake up at 2am that was epigastric in location He states that 2 weeks ago was ice cream He tried to throw up and have a BM to improve his symptoms He states that a week ago he had another episode when he woke up feeling sick after mashed potatoes He ate [place] food noodles with peppers and spicy with peanut sauce and went to bed and at 1am he started hearting  He notices pain that is on the left flank He states that the pain is crampy and usually resolves in a day This episode started this morning after eating the [place] food  No past medical history on file.  Current Outpatient Medications  Medication Sig Dispense Refill  . clobetasol ointment (TEMOVATE) 0.05 % Apply 1 application topically 2 (two) times daily. (Patient not taking: Reported on 11/28/2016) 30 g 0   No current facility-administered medications for this visit.     Allergies: Not on File  No past surgical history on file.  Social History   Socioeconomic History  . Marital status: Married    Spouse name: Not on file  . Number of children: Not on file  . Years of education: Not on file  . Highest education level: Not on file  Occupational History  . Not on file  Social Needs  . Financial resource strain: Not on file  . Food insecurity:    Worry: Not on file    Inability: Not on file  . Transportation needs:    Medical: Not on file    Non-medical: Not on file  Tobacco Use  . Smoking status: Never Smoker  . Smokeless tobacco: Never Used  Substance and Sexual Activity  . Alcohol use: No    Alcohol/week: 0.0 oz  . Drug use: No  . Sexual activity: Not on file  Lifestyle  . Physical activity:    Days per week: Not on file    Minutes per session:  Not on file  . Stress: Not on file  Relationships  . Social connections:    Talks on phone: Not on file    Gets together: Not on file    Attends religious service: Not on file    Active member of club or organization: Not on file    Attends meetings of clubs or organizations: Not on file    Relationship status: Not on file  Other Topics Concern  . Not on file  Social History Narrative  . Not on file    Family History  Problem Relation Age of Onset  . Diabetes Father   . Hyperlipidemia Brother      ROS Review of Systems See HPI Constitution: No fevers or chills No malaise No diaphoresis Skin: No rash or itching Eyes: no blurry vision, no double vision GU: no dysuria or hematuria Neuro: no dizziness or headaches all others reviewed and negative   Objective: Vitals:   09/24/17 1058  BP: (!) 159/100  Pulse: 78  Resp: 17  Temp: 98 F (36.7 C)  TempSrc: Oral  SpO2: 100%  Weight: 169 lb (76.7 kg)  Height: 5' 5.75" (1.67 m)    Physical Exam  Constitutional: He appears well-developed  and well-nourished.  HENT:  Head: Normocephalic and atraumatic.  Eyes: Pupils are equal, round, and reactive to light. EOM are normal.  Cardiovascular: Normal rate, regular rhythm and normal heart sounds.  Pulmonary/Chest: Effort normal and breath sounds normal. No stridor. No respiratory distress. He has no wheezes. He has no rales.  Abdominal: Normal appearance and bowel sounds are normal. He exhibits no shifting dullness, no distension, no pulsatile liver, no fluid wave, no abdominal bruit, no ascites, no pulsatile midline mass and no mass. There is no hepatosplenomegaly. There is tenderness in the epigastric area. There is no rigidity, no rebound, no guarding, no CVA tenderness, no tenderness at McBurney's point and negative Murphy's sign. No hernia.        Component     Latest Ref Rng & Units 09/24/2017 09/24/2017        11:17 AM 11:17 AM  Color, UA     yellow yellow     Clarity, UA     clear clear   Glucose     negative mg/dL negative   Bilirubin, UA     negative negative negative  Specific Gravity, UA     1.010 - 1.025 1.020   RBC, UA     negative negative   pH, UA     5.0 - 8.0 5.0   Protein, UA     negative mg/dL negative   Urobilinogen, UA     0.2 or 1.0 E.U./dL 0.2   Nitrite, UA     Negative Negative   Leukocytes, UA     Negative Negative      Wbc 9.3 with no left shift Lipase 25 AST 24 ALT 32 UA negative for blood or bacteria  Component     Latest Ref Rng & Units 09/24/2017  WBC     3.4 - 10.8 x10E3/uL 9.3  RBC     4.14 - 5.80 x10E6/uL 5.04  Hemoglobin     13.0 - 17.7 g/dL 10.2  HCT     72.5 - 36.6 % 43.9  MCV     79 - 97 fL 87  MCH     26.6 - 33.0 pg 31.0  MCHC     31.5 - 35.7 g/dL 44.0  RDW     34.7 - 42.5 % 13.8  Platelets     150 - 450 x10E3/uL 293  Neutrophils     Not Estab. % 81  Lymphs     Not Estab. % 12  Monocytes     Not Estab. % 6  Eos     Not Estab. % 1  Basos     Not Estab. % 0  NEUT#     1.4 - 7.0 x10E3/uL 7.5 (H)  Lymphocyte #     0.7 - 3.1 x10E3/uL 1.1  Monocytes Absolute     0.1 - 0.9 x10E3/uL 0.6  EOS (ABSOLUTE)     0.0 - 0.4 x10E3/uL 0.1  Basophils Absolute     0.0 - 0.2 x10E3/uL 0.0  CLINICAL DATA:  Epigastric abdominal pain.  EXAM: ABDOMEN ULTRASOUND COMPLETE  COMPARISON:  None.  FINDINGS: Gallbladder: Cholelithiasis is noted with the largest gallstone measuring 1.4 cm. Sludge is noted as well. No gallbladder wall thickening or pericholecystic fluid is noted. The absence or presence of sonographic Murphy's sign was not reported by the sonographer.  Common bile duct: Diameter: 2.9 mm which is within normal limits.  Liver: No focal lesion identified. Within normal limits in parenchymal echogenicity. Portal vein is patent on  color Doppler imaging with normal direction of blood flow towards the liver.  IVC: No abnormality visualized.  Pancreas: Visualized portion  unremarkable.  Spleen: Size and appearance within normal limits.  Right Kidney: Length: 11.1 cm. Echogenicity within normal limits. No mass or hydronephrosis visualized.  Left Kidney: Length: 9.1 cm. Echogenicity within normal limits. No mass or hydronephrosis visualized.  Abdominal aorta: No aneurysm visualized.  Other findings: None.  IMPRESSION: Cholelithiasis and gallbladder sludge is noted without evidence of cholecystitis.  No other abnormality seen in the abdomen.   Electronically Signed   By: Lupita RaiderJames  Green Jr, M.D.   On: 09/24/2017 14:24  Assessment and Plan [name] was seen today for abdominal pain.  Diagnoses and all orders for this visit:  Abdominal pain, epigastric -     Cancel: CBC with Differential/Platelet -     Cancel: Comprehensive metabolic panel -     Cancel: Lipase -     Cancel: Comprehensive metabolic panel; Future -     Cancel: CBC with Differential/Platelet; Future -     Cancel: Lipase; Future -     EKG 12-Lead -     CBC with Differential/Platelet -     Comprehensive metabolic panel -     Lipase -     Cancel: US ABDOMEN LIMITED RUQ; Future -     US Abdomen Complete; Future  Abdominal pain, left lower quadrant -     POCT urinalysis dipstick -     POCT Microscopic Urinalysis ([facility]) -     Cancel: Comprehensive metabolic panel -     Cancel: Lipase   Pt has been referred for US of the abdomen Which showed gallstones and sludge but no inflammation His wbc was normal Started empiric treatment for reflux for his epigastric pain Plan on outpatient follow up for his gallstones with General Surgery Return to clinic as needed ER precautions discussed  A total of 30 minutes were spent face-to-face with the patient during this encounter and over half of that time was spent on counseling and coordination of care.  Discussed anticipated plan for follow up and reviewed labs    Itzel Lowrimore A Creta LevinStallings

## 2017-09-25 ENCOUNTER — Ambulatory Visit: Payer: BC Managed Care – PPO

## 2017-09-25 ENCOUNTER — Telehealth: Payer: Self-pay | Admitting: Family Medicine

## 2017-09-25 NOTE — Telephone Encounter (Signed)
Copied from CRM [id number]. Topic: Inquiry >> Sep 25, 2017  4:15 PM Steven Miles, Don'Quashia, NT wrote: Reason for CRM: Patient called and is inquiring about his bal results. Please cal  CB#[phone]

## 2017-09-25 NOTE — Telephone Encounter (Signed)
Copied from CRM [id number]. Topic: Appointment Scheduling - Scheduling Inquiry for Clinic >> Sep 25, 2017  9:40 AM Cipriano BunkerLambe, Annette S wrote: Reason for CRM: pt called saying he is feeling much better and doesn't need the general surgery referral anymore.

## 2017-09-27 NOTE — Telephone Encounter (Signed)
Message sent to Dr. Creta LevinStallings re: pt requesting results bal ?????

## 2017-10-02 NOTE — Telephone Encounter (Signed)
Please let the patient know that I would like him to follow up to reassess his symptoms. He had gallstones but the gallbladder is not inflamed. He should continue the reflux meds and still see General Surgery as an outpatient.

## 2017-10-03 NOTE — Telephone Encounter (Signed)
Pt canceled Sx consult. Does not need follow up. Pt feeling good - continuing reflux meds Not taking opioids

## 2017-10-17 ENCOUNTER — Ambulatory Visit: Payer: BC Managed Care – PPO | Admitting: Family Medicine

## 2017-10-17 ENCOUNTER — Ambulatory Visit: Payer: Self-pay

## 2017-10-17 NOTE — Telephone Encounter (Signed)
Abdominal pain to middle upper abdomen. Started last night and pain is "constant." No other symptoms. Appointment made for today with Dr. Creta LevinStallings, but pt.'s wife states "he doesn't want to see her - he wants a second opinion."  That appointment cancelled as requested. Will go to UC.  Reason for Disposition . [1] MILD-MODERATE pain AND [2] constant AND [3] present > 2 hours  Answer Assessment - Initial Assessment Questions 1. LOCATION: "Where does it hurt?"      Upper abd. - middle 2. RADIATION: "Does the pain shoot anywhere else?" (e.g., chest, back)     No 3. ONSET: "When did the pain begin?" (Minutes, hours or days ago)      Last night 4. SUDDEN: "Gradual or sudden onset?"     Sudden 5. PATTERN "Does the pain come and go, or is it constant?"    - If constant: "Is it getting better, staying the same, or worsening?"      (Note: Constant means the pain never goes away completely; most serious pain is constant and it progresses)     - If intermittent: "How long does it last?" "Do you have pain now?"     (Note: Intermittent means the pain goes away completely between bouts)     Constant 6. SEVERITY: "How bad is the pain?"  (e.g., Scale 1-10; mild, moderate, or severe)    - MILD (1-3): doesn't interfere with normal activities, abdomen soft and not tender to touch     - MODERATE (4-7): interferes with normal activities or awakens from sleep, tender to touch     - SEVERE (8-10): excruciating pain, doubled over, unable to do any normal activities       Moderate 7. RECURRENT SYMPTOM: "Have you ever had this type of abdominal pain before?" If so, ask: "When was the last time?" and "What happened that time?"      Yes 8. CAUSE: "What do you think is causing the abdominal pain?"     Unsure 9. RELIEVING/AGGRAVATING FACTORS: "What makes it better or worse?" (e.g., movement, antacids, bowel movement)     No 10. OTHER SYMPTOMS: "Has there been any vomiting, diarrhea, constipation, or urine  problems?"       No  Protocols used: ABDOMINAL PAIN - MALE-A-AH

## 2017-10-19 ENCOUNTER — Other Ambulatory Visit: Payer: Self-pay

## 2017-10-19 ENCOUNTER — Encounter (HOSPITAL_COMMUNITY): Payer: Self-pay | Admitting: Nurse Practitioner

## 2017-10-19 DIAGNOSIS — Z79899 Other long term (current) drug therapy: Secondary | ICD-10-CM | POA: Diagnosis not present

## 2017-10-19 DIAGNOSIS — K8080 Other cholelithiasis without obstruction: Secondary | ICD-10-CM | POA: Diagnosis not present

## 2017-10-19 DIAGNOSIS — R109 Unspecified abdominal pain: Secondary | ICD-10-CM | POA: Diagnosis present

## 2017-10-19 LAB — CBC
HCT: 42.9 % (ref 39.0–52.0)
HEMOGLOBIN: 15.2 g/dL (ref 13.0–17.0)
MCH: 31.5 pg (ref 26.0–34.0)
MCHC: 35.4 g/dL (ref 30.0–36.0)
MCV: 88.8 fL (ref 78.0–100.0)
Platelets: 302 10*3/uL (ref 150–400)
RBC: 4.83 MIL/uL (ref 4.22–5.81)
RDW: 12.2 % (ref 11.5–15.5)
WBC: 8.4 10*3/uL (ref 4.0–10.5)

## 2017-10-19 LAB — I-STAT CHEM 8, ED
BUN: 18 mg/dL (ref 6–20)
CALCIUM ION: 1.02 mmol/L — AB (ref 1.15–1.40)
CHLORIDE: 107 mmol/L (ref 98–111)
Creatinine, Ser: 1.2 mg/dL (ref 0.61–1.24)
GLUCOSE: 111 mg/dL — AB (ref 70–99)
HCT: 43 % (ref 39.0–52.0)
Hemoglobin: 14.6 g/dL (ref 13.0–17.0)
Potassium: 3.7 mmol/L (ref 3.5–5.1)
Sodium: 139 mmol/L (ref 135–145)
TCO2: 23 mmol/L (ref 22–32)

## 2017-10-19 NOTE — ED Triage Notes (Signed)
Pt presents with mild pain induced distress c/o diffuse nagging abdominal pain, states he has been treated for stomach ulcers by PCP, chart review imaging indicates cholelithiasis dx on 09/24/17. Denies N/V.

## 2017-10-20 ENCOUNTER — Emergency Department (HOSPITAL_COMMUNITY)
Admission: EM | Admit: 2017-10-20 | Discharge: 2017-10-20 | Disposition: A | Discharge status: Home / Self Care | Payer: BC Managed Care – PPO | Attending: Emergency Medicine | Admitting: Emergency Medicine

## 2017-10-20 ENCOUNTER — Emergency Department (HOSPITAL_COMMUNITY): Payer: BC Managed Care – PPO

## 2017-10-20 DIAGNOSIS — R748 Abnormal levels of other serum enzymes: Secondary | ICD-10-CM

## 2017-10-20 DIAGNOSIS — K8020 Calculus of gallbladder without cholecystitis without obstruction: Secondary | ICD-10-CM

## 2017-10-20 LAB — COMPREHENSIVE METABOLIC PANEL
ALBUMIN: 3.9 g/dL (ref 3.5–5.0)
ALT: 208 U/L — ABNORMAL HIGH (ref 0–44)
ANION GAP: 9 (ref 5–15)
AST: 279 U/L — AB (ref 15–41)
Alkaline Phosphatase: 113 U/L (ref 38–126)
BILIRUBIN TOTAL: 0.7 mg/dL (ref 0.3–1.2)
BUN: 17 mg/dL (ref 6–20)
CO2: 26 mmol/L (ref 22–32)
Calcium: 8.9 mg/dL (ref 8.9–10.3)
Chloride: 106 mmol/L (ref 98–111)
Creatinine, Ser: 1.13 mg/dL (ref 0.61–1.24)
GFR calc Af Amer: >60 mL/min (ref >60)
GFR calc non Af Amer: >60 mL/min (ref >60)
GLUCOSE: 119 mg/dL — AB (ref 70–99)
POTASSIUM: 4.4 mmol/L (ref 3.5–5.1)
SODIUM: 141 mmol/L (ref 135–145)
TOTAL PROTEIN: 7.2 g/dL (ref 6.5–8.1)

## 2017-10-20 LAB — URINALYSIS, ROUTINE W REFLEX MICROSCOPIC
Bacteria, UA: NONE SEEN
Bilirubin Urine: NEGATIVE
Glucose, UA: NEGATIVE mg/dL
KETONES UR: NEGATIVE mg/dL
LEUKOCYTES UA: NEGATIVE
Nitrite: NEGATIVE
PH: 6 (ref 5.0–8.0)
Protein, ur: NEGATIVE mg/dL
SPECIFIC GRAVITY, URINE: 1.016 (ref 1.005–1.030)

## 2017-10-20 LAB — LIPASE, BLOOD: Lipase: 31 U/L (ref 11–51)

## 2017-10-20 MED ORDER — ONDANSETRON 4 MG PO TBDP: 4.0000 mg | ORAL_TABLET | Freq: Three times a day (TID) | ORAL | 0 refills | Status: DC | PRN

## 2017-10-20 MED ORDER — IOPAMIDOL (ISOVUE-300) INJECTION 61%
100.0000 mL | Freq: Once | INTRAVENOUS | Status: AC | PRN
  Administered 2017-10-20: 100 mL via INTRAVENOUS

## 2017-10-20 MED ORDER — ONDANSETRON HCL 4 MG/2ML IJ SOLN
4.0000 mg | Freq: Once | INTRAMUSCULAR | Status: AC
  Administered 2017-10-20: 4 mg via INTRAVENOUS
  Filled 2017-10-20: qty 2

## 2017-10-20 MED ORDER — HYDROMORPHONE HCL 1 MG/ML IJ SOLN
1.0000 mg | Freq: Once | INTRAMUSCULAR | Status: AC
  Administered 2017-10-20: 1 mg via INTRAVENOUS
  Filled 2017-10-20: qty 1

## 2017-10-20 MED ORDER — HYDROCODONE-ACETAMINOPHEN 5-325 MG PO TABS: 1.0000 | ORAL_TABLET | Freq: Four times a day (QID) | ORAL | 0 refills | Status: AC | PRN

## 2017-10-20 NOTE — ED Provider Notes (Signed)
[facility]-EMERGENCY DEPT Provider Note   CSN: [id number] Arrival date & time: 10/19/17  2217     History   Chief Complaint Chief Complaint  Patient presents with  . Abdominal Pain    HPI Steven Miles is a 47 y.o. male.  Patient presents to the emergency department with chief complaint of epigastric abdominal pain.  He states that he has been having the pain for the past several weeks, but reports that his symptoms significantly worsened today.  He states that the pain brought him to the emergency department.  He denies any fever.  He has had some nausea and vomiting.  He reports having been seen by his primary care doctor, and had an ultrasound performed which showed cholelithiasis.  He is concerned that something else is wrong, does not know who he needs to see or what else he needs to do.  He has taken Carafate and Protonix for suspected peptic ulcer disease with no relief.  The history is provided by the patient. No language interpreter was used.    History reviewed. No pertinent past medical history.  There are no active problems to display for this patient.   History reviewed. No pertinent surgical history.      Home Medications    Prior to Admission medications   Medication Sig Start Date End Date Taking? Authorizing Provider  pantoprazole (PROTONIX) 40 MG tablet Take 1 tablet (40 mg total) by mouth daily. 09/24/17  Yes Doristine Bosworth, MD  HYDROcodone-acetaminophen (NORCO/VICODIN) 5-325 MG tablet Take 1-2 tablets by mouth every 6 (six) hours as needed. 10/20/17   Roxy Horseman, PA-C  ondansetron (ZOFRAN ODT) 4 MG disintegrating tablet Take 1 tablet (4 mg total) by mouth every 8 (eight) hours as needed for nausea or vomiting. 10/20/17   Roxy Horseman, PA-C  sucralfate (CARAFATE) 1 g tablet Take 1 tablet (1 g total) by mouth 4 (four) times daily -  with meals and at bedtime for 14 days. 09/24/17 10/08/17  Doristine Bosworth, MD  traMADol (ULTRAM) 50  MG tablet Take 1 tablet (50 mg total) by mouth every 8 (eight) hours as needed. Patient not taking: Reported on 10/20/2017 09/24/17   Doristine Bosworth, MD    Family History Family History  Problem Relation Age of Onset  . Diabetes Father   . Hyperlipidemia Brother     Social History Social History   Tobacco Use  . Smoking status: Never Smoker  . Smokeless tobacco: Never Used  Substance Use Topics  . Alcohol use: No    Alcohol/week: 0.0 oz  . Drug use: No     Allergies   Patient has no known allergies.   Review of Systems Review of Systems  All other systems reviewed and are negative.    Physical Exam Updated Vital Signs BP 125/80 (BP Location: Right Arm)   Pulse 60   Temp 97.8 F (36.6 C) (Oral)   Resp 20   SpO2 96%   Physical Exam  Constitutional: He is oriented to person, place, and time. He appears well-developed and well-nourished.  HENT:  Head: Normocephalic and atraumatic.  Eyes: Pupils are equal, round, and reactive to light. Conjunctivae and EOM are normal. Right eye exhibits no discharge. Left eye exhibits no discharge. No scleral icterus.  Neck: Normal range of motion. Neck supple. No JVD present.  Cardiovascular: Normal rate, regular rhythm and normal heart sounds. Exam reveals no gallop and no friction rub.  No murmur heard. Pulmonary/Chest: Effort normal and  breath sounds normal. No respiratory distress. He has no wheezes. He has no rales. He exhibits no tenderness.  Abdominal: Soft. He exhibits no distension and no mass. There is no rebound and no guarding.  Musculoskeletal: Normal range of motion. He exhibits no edema or tenderness.  Neurological: He is alert and oriented to person, place, and time.  Skin: Skin is warm and dry.  Psychiatric: He has a normal mood and affect. His behavior is normal. Judgment and thought content normal.  Nursing note and vitals reviewed.    ED Treatments / Results  Labs (all labs ordered are listed, but only  abnormal results are displayed) Labs Reviewed  URINALYSIS, ROUTINE W REFLEX MICROSCOPIC - Abnormal; Notable for the following components:      Result Value   Hgb urine dipstick SMALL (*)    All other components within normal limits  COMPREHENSIVE METABOLIC PANEL - Abnormal; Notable for the following components:   Glucose, Bld 119 (*)    AST 279 (*)    ALT 208 (*)    All other components within normal limits  I-STAT CHEM 8, ED - Abnormal; Notable for the following components:   Glucose, Bld 111 (*)    Calcium, Ion 1.02 (*)    All other components within normal limits  CBC  LIPASE, BLOOD    EKG None  Radiology Ct Abdomen Pelvis W Contrast  Result Date: 10/20/2017 CLINICAL DATA:  Mid abdominal pain history of stomach ulcers EXAM: CT ABDOMEN AND PELVIS WITH CONTRAST TECHNIQUE: Multidetector CT imaging of the abdomen and pelvis was performed using the standard protocol following bolus administration of intravenous contrast. CONTRAST:  100mL ISOVUE-300 IOPAMIDOL (ISOVUE-300) INJECTION 61% COMPARISON:  Ultrasound 09/24/2017 FINDINGS: Lower chest: No acute abnormality. Hepatobiliary: No focal hepatic abnormality. Small stones in the gallbladder. No biliary dilatation Pancreas: Unremarkable. No pancreatic ductal dilatation or surrounding inflammatory changes. Spleen: Normal in size without focal abnormality. Adrenals/Urinary Tract: Adrenal glands are unremarkable. Kidneys are normal, without renal calculi, focal lesion, or hydronephrosis. Bladder is unremarkable. Stomach/Bowel: Stomach is within normal limits. Appendix appears normal. No evidence of bowel wall thickening, distention, or inflammatory changes. Vascular/Lymphatic: No significant vascular findings are present. No enlarged abdominal or pelvic lymph nodes. Reproductive: Prostate is unremarkable. Other: No abdominal wall hernia or abnormality. No abdominopelvic ascites. Musculoskeletal: No acute or significant osseous findings. IMPRESSION:  1. No definite CT evidence for acute intra-abdominal or pelvic abnormality. 2. Gallstones Electronically Signed   By: Jasmine PangKim  Fujinaga M.D.   On: 10/20/2017 02:03   [place] Abdomen Limited  Result Date: 10/20/2017 CLINICAL DATA:  Right upper quadrant pain last night EXAM: ULTRASOUND ABDOMEN LIMITED RIGHT UPPER QUADRANT COMPARISON:  CT abdomen and pelvis 10/20/2017 FINDINGS: Gallbladder: Stones and sludge demonstrated in the gallbladder. Largest stone measures about 2 cm maximal diameter. No gallbladder wall thickening or edema. Murphy's sign is negative. Common bile duct: Diameter: 3.1 mm, normal Liver: No focal lesion identified. Within normal limits in parenchymal echogenicity. Portal vein is patent on color Doppler imaging with normal direction of blood flow towards the liver. IMPRESSION: Cholelithiasis without additional changes to suggest cholecystitis. Electronically Signed   By: Burman NievesWilliam  Stevens M.D.   On: 10/20/2017 05:07    Procedures Procedures (including critical care time)  Medications Ordered in ED Medications  HYDROmorphone (DILAUDID) injection 1 mg (1 mg Intravenous Given 10/20/17 0123)  ondansetron (ZOFRAN) injection 4 mg (4 mg Intravenous Given 10/20/17 0123)  iopamidol (ISOVUE-300) 61 % injection 100 mL (100 mLs Intravenous Contrast Given 10/20/17 0134)  Initial Impression / Assessment and Plan / ED Course  I have reviewed the triage vital signs and the nursing notes.  Pertinent labs & imaging results that were available during my care of the patient were reviewed by me and considered in my medical decision making (see chart for details).     Patient here with epigastric abdominal pain.  He is nontender on my exam.  He is well-appearing.  He is in no acute distress.  Patient is very concerned about his symptoms.  He would like answers tonight.  I offered him CT imaging for further evaluation.  CT shows all stones, but no other acute abnormality.  LFTs are elevated in the 200s.   Will check ultrasound.  Ultrasound shows cholelithiasis, but nothing to suggest cholecystitis.  Diameter of common bile duct is within normal limits.  It is possible that he passed a gallstone.  He is not having any pain now.  He is afebrile.  He does not have a leukocytosis.  Would recommend outpatient follow-up with general surgery.  We will give Vicodin as needed.  Patient understands and agrees with the plan.  Return precautions given.  Patient discussed with Dr. Eudelia Bunch, who agrees with the plan.  Final Clinical Impressions(s) / ED Diagnoses   Final diagnoses:  Symptomatic cholelithiasis  Elevated liver enzymes    ED Discharge Orders        Ordered    HYDROcodone-acetaminophen (NORCO/VICODIN) 5-325 MG tablet  Every 6 hours PRN     10/20/17 0532    ondansetron (ZOFRAN ODT) 4 MG disintegrating tablet  Every 8 hours PRN     10/20/17 0532       Roxy Horseman, PA-C 10/20/17 0541    Eudelia Bunch Amadeo Garnet, MD 10/21/17 [phone]

## 2017-10-20 NOTE — Discharge Instructions (Addendum)
Today your evaluated for epigastric abdominal pain.  You were found to have gallstones and elevated liver enzymes.  Please contact the general surgery group provided.  You may need to have your gallbladder taken out.

## 2017-10-20 NOTE — ED Notes (Signed)
Urine culture sent down with UA. 

## 2017-10-22 ENCOUNTER — Ambulatory Visit: Payer: BC Managed Care – PPO | Admitting: Physician Assistant

## 2017-10-26 ENCOUNTER — Ambulatory Visit: Payer: Self-pay | Admitting: General Surgery

## 2017-10-26 NOTE — Pre-Procedure Instructions (Signed)
Steven BrunsKevin Miles  10/26/2017    Your procedure is scheduled on Thursday, November 01, 2017 at 7:30 AM.   Report to [facility] Entrance "A" Admitting Office at 5:30 AM.   Call this number if you have problems the morning of surgery: [phone]   Questions prior to day of surgery, please call [phone] between 8 & 4 PM.   Remember:  Do not eat after midnight Wednesday, 10/31/17.  You may drink clear liquids until 4:30 AM .  Clear liquids allowed are:  Water, Juice (non-citric/without pulp), Carbonated beverages, Clear tea, Black coffee, plain Jello, Gatorade, plain Popsicles                      Do not wear jewelry.  Do not wear lotions, powders, cologne or deodorant.  Men may shave face and neck.  Do not bring valuables to the hospital.  [facility] is not responsible for any belongings or valuables.  Contacts, dentures or bridgework may not be worn into surgery.  Leave your suitcase in the car.  After surgery it may be brought to your room.  For patients admitted to the hospital, discharge time will be determined by your treatment team.  Patients discharged the day of surgery will not be allowed to drive home.   [place] - Preparing for Surgery  Before surgery, you can play an important role.  Because skin is not sterile, your skin needs to be as free of germs as possible.  You can reduce the number of germs on you skin by washing with CHG (chlorahexidine gluconate) soap before surgery.  CHG is an antiseptic cleaner which kills germs and bonds with the skin to continue killing germs even after washing.  Oral Hygiene is also important in reducing the risk of infection.  Remember to brush your teeth with your regular toothpaste the morning of surgery.  Please DO NOT use if you have an allergy to CHG or antibacterial soaps.  If your skin becomes reddened/irritated stop using the CHG and inform your nurse when you arrive at Short Stay.  Do not shave (including legs and  underarms) for at least 48 hours prior to the first CHG shower.  You may shave your face.  Please follow these instructions carefully:   1.  Shower with CHG Soap the night before surgery and the morning of Surgery.  2.  If you choose to wash your hair, wash your hair first as usual with your normal shampoo.  3.  After you shampoo, rinse your hair and body thoroughly to remove the shampoo. 4.  Use CHG as you would any other liquid soap.  You can apply chg directly to the skin and wash gently with a      scrungie or washcloth.           5.  Apply the CHG Soap to your body ONLY FROM THE NECK DOWN.   Do not use on open wounds or open sores. Avoid contact with your eyes, ears, mouth and genitals (private parts).  Wash genitals (private parts) with your normal soap.  6.  Wash thoroughly, paying special attention to the area where your surgery will be performed.  7.  Thoroughly rinse your body with warm water from the neck down.  8.  DO NOT shower/wash with your normal soap after using and rinsing off the CHG Soap.  9.  Pat yourself dry with a clean towel.  10.  Wear clean pajamas.            11.  Place clean sheets on your bed the night of your first shower and do not sleep with pets.  Day of Surgery  Shower as above. Do not apply any lotions/deodorants the morning of surgery.   Please wear clean clothes to the hospital. Remember to brush your teeth with toothpaste.  Please read over the fact sheets that you were given.

## 2017-10-29 ENCOUNTER — Encounter (HOSPITAL_COMMUNITY)
Admission: RE | Admit: 2017-10-29 | Discharge: 2017-10-29 | Disposition: A | Discharge status: Home / Self Care | Payer: BC Managed Care – PPO | Source: Ambulatory Visit | Attending: General Surgery | Admitting: General Surgery

## 2017-10-29 ENCOUNTER — Encounter (HOSPITAL_COMMUNITY): Payer: Self-pay

## 2017-10-29 ENCOUNTER — Other Ambulatory Visit: Payer: Self-pay

## 2017-10-29 ENCOUNTER — Inpatient Hospital Stay (HOSPITAL_COMMUNITY)
Admission: EM | Admit: 2017-10-29 | Discharge: 2017-11-02 | DRG: 418 | Disposition: A | Discharge status: Home / Self Care | Payer: BC Managed Care – PPO | Attending: General Surgery | Admitting: General Surgery

## 2017-10-29 DIAGNOSIS — R7401 Elevation of levels of liver transaminase levels: Secondary | ICD-10-CM

## 2017-10-29 DIAGNOSIS — R1011 Right upper quadrant pain: Secondary | ICD-10-CM

## 2017-10-29 DIAGNOSIS — Z8249 Family history of ischemic heart disease and other diseases of the circulatory system: Secondary | ICD-10-CM

## 2017-10-29 DIAGNOSIS — Z419 Encounter for procedure for purposes other than remedying health state, unspecified: Secondary | ICD-10-CM

## 2017-10-29 DIAGNOSIS — K851 Biliary acute pancreatitis without necrosis or infection: Principal | ICD-10-CM | POA: Diagnosis present

## 2017-10-29 DIAGNOSIS — Z825 Family history of asthma and other chronic lower respiratory diseases: Secondary | ICD-10-CM

## 2017-10-29 DIAGNOSIS — K8012 Calculus of gallbladder with acute and chronic cholecystitis without obstruction: Secondary | ICD-10-CM | POA: Diagnosis present

## 2017-10-29 DIAGNOSIS — R740 Nonspecific elevation of levels of transaminase and lactic acid dehydrogenase [LDH]: Secondary | ICD-10-CM

## 2017-10-29 DIAGNOSIS — Z833 Family history of diabetes mellitus: Secondary | ICD-10-CM | POA: Diagnosis not present

## 2017-10-29 DIAGNOSIS — K819 Cholecystitis, unspecified: Secondary | ICD-10-CM | POA: Diagnosis present

## 2017-10-29 DIAGNOSIS — K8020 Calculus of gallbladder without cholecystitis without obstruction: Secondary | ICD-10-CM

## 2017-10-29 HISTORY — DX: Other specified health status: Z78.9

## 2017-10-29 HISTORY — DX: Calculus of gallbladder without cholecystitis without obstruction: K80.20

## 2017-10-29 LAB — COMPREHENSIVE METABOLIC PANEL
ALBUMIN: 4.1 g/dL (ref 3.5–5.0)
ALK PHOS: 294 U/L — AB (ref 38–126)
ALT: 838 U/L — AB (ref 0–44)
AST: 485 U/L — AB (ref 15–41)
Anion gap: 9 (ref 5–15)
BUN: 15 mg/dL (ref 6–20)
CHLORIDE: 101 mmol/L (ref 98–111)
CO2: 28 mmol/L (ref 22–32)
CREATININE: 1.26 mg/dL — AB (ref 0.61–1.24)
Calcium: 9.6 mg/dL (ref 8.9–10.3)
GFR calc non Af Amer: >60 mL/min (ref >60)
GLUCOSE: 129 mg/dL — AB (ref 70–99)
Potassium: 4.1 mmol/L (ref 3.5–5.1)
SODIUM: 138 mmol/L (ref 135–145)
Total Bilirubin: 2.9 mg/dL — ABNORMAL HIGH (ref 0.3–1.2)
Total Protein: 7.4 g/dL (ref 6.5–8.1)

## 2017-10-29 LAB — CBC
HCT: 47.7 % (ref 39.0–52.0)
Hemoglobin: 15.8 g/dL (ref 13.0–17.0)
MCH: 30.8 pg (ref 26.0–34.0)
MCHC: 33.1 g/dL (ref 30.0–36.0)
MCV: 93 fL (ref 78.0–100.0)
PLATELETS: 275 10*3/uL (ref 150–400)
RBC: 5.13 MIL/uL (ref 4.22–5.81)
RDW: 12.7 % (ref 11.5–15.5)
WBC: 9 10*3/uL (ref 4.0–10.5)

## 2017-10-29 LAB — LIPASE, BLOOD: Lipase: 2082 U/L — ABNORMAL HIGH (ref 11–51)

## 2017-10-29 MED ORDER — ONDANSETRON HCL 4 MG/2ML IJ SOLN
4.0000 mg | Freq: Once | INTRAMUSCULAR | Status: AC
  Administered 2017-10-29: 4 mg via INTRAVENOUS
  Filled 2017-10-29: qty 2

## 2017-10-29 MED ORDER — ACETAMINOPHEN 650 MG RE SUPP: 650.0000 mg | Freq: Four times a day (QID) | RECTAL | Status: DC | PRN

## 2017-10-29 MED ORDER — ACETAMINOPHEN 325 MG PO TABS: 650.0000 mg | ORAL_TABLET | Freq: Four times a day (QID) | ORAL | Status: DC | PRN

## 2017-10-29 MED ORDER — ONDANSETRON HCL 4 MG/2ML IJ SOLN
4.0000 mg | Freq: Four times a day (QID) | INTRAMUSCULAR | Status: DC | PRN
  Filled 2017-10-29: qty 2

## 2017-10-29 MED ORDER — MORPHINE SULFATE (PF) 2 MG/ML IV SOLN
2.0000 mg | INTRAVENOUS | Status: DC | PRN
  Administered 2017-10-30: 2 mg via INTRAVENOUS
  Filled 2017-10-29: qty 1

## 2017-10-29 MED ORDER — OXYCODONE HCL 5 MG PO TABS: 5.0000 mg | ORAL_TABLET | ORAL | Status: DC | PRN

## 2017-10-29 MED ORDER — SODIUM CHLORIDE 0.9 % IV SOLN
INTRAVENOUS | Status: DC
  Administered 2017-10-29 – 2017-11-02 (×9): via INTRAVENOUS

## 2017-10-29 MED ORDER — ONDANSETRON 4 MG PO TBDP
4.0000 mg | ORAL_TABLET | Freq: Four times a day (QID) | ORAL | Status: DC | PRN
  Administered 2017-10-30: 4 mg via ORAL
  Filled 2017-10-29: qty 1

## 2017-10-29 MED ORDER — LACTATED RINGERS IV BOLUS
1000.0000 mL | Freq: Once | INTRAVENOUS | Status: AC
  Administered 2017-10-29: 1000 mL via INTRAVENOUS

## 2017-10-29 MED ORDER — MORPHINE SULFATE (PF) 4 MG/ML IV SOLN
4.0000 mg | Freq: Once | INTRAVENOUS | Status: AC
  Administered 2017-10-29: 4 mg via INTRAVENOUS
  Filled 2017-10-29: qty 1

## 2017-10-29 MED ORDER — ENOXAPARIN SODIUM 40 MG/0.4ML ~~LOC~~ SOLN
40.0000 mg | SUBCUTANEOUS | Status: DC
  Administered 2017-11-02: 40 mg via SUBCUTANEOUS
  Filled 2017-10-29: qty 0.4

## 2017-10-29 MED ORDER — SIMETHICONE 80 MG PO CHEW: 40.0000 mg | CHEWABLE_TABLET | Freq: Four times a day (QID) | ORAL | Status: DC | PRN

## 2017-10-29 MED ORDER — SODIUM CHLORIDE 0.9 % IV SOLN
2.0000 g | INTRAVENOUS | Status: DC
  Administered 2017-10-29: 2 g via INTRAVENOUS
  Filled 2017-10-29: qty 20

## 2017-10-29 NOTE — Progress Notes (Signed)
I informed patient he is scheduled for MRI tonight.  Patient informed nurse " I think I have a metal filling in my teeth but  I am not sure. It was put in when I was young." Dr Dwain SarnaWakefield page and informed. Per Dr Dwain SarnaWakefield nurse should inform MRI . Called MRI and was informed its ok for patient to have MRI done.

## 2017-10-29 NOTE — ED Notes (Addendum)
RN will call me back

## 2017-10-29 NOTE — Progress Notes (Signed)
Per nursing report pt had significant abd pain during his PAT appointment today. I was not called to personally examine the patient. Review of bloodwork performed today shows significantly elevated AST/ALT and alk phos compared to 9d when he was seen in the ED for abd pain. The results were called to Dr. Dois Davenport office for his review. Per nurse notes the pt was instructed to go to th ED if his symptoms worsened.  Hepatic Function Latest Ref Rng & Units 10/29/2017 10/20/2017 09/24/2017  Total Protein 6.5 - 8.1 g/dL 7.4 7.2 7.2  Albumin 3.5 - 5.0 g/dL 4.1 3.9 4.6  AST 15 - 41 U/L 485(H) 279(H) 24  ALT 0 - 44 U/L 838(H) 208(H) 32  Alk Phosphatase 38 - 126 U/L 294(H) 113 86  Total Bilirubin 0.3 - 1.2 mg/dL 2.9(H) 0.7 0.2  Bilirubin, Direct 0.00 - 0.40 mg/dL - - Wynonia Musty [facility] Short Stay Center/Anesthesiology Phone [phone] 10/29/2017 4:03 PM

## 2017-10-29 NOTE — Progress Notes (Signed)
Report received from CrowleyAdrian RN,and bed made available for pt.

## 2017-10-29 NOTE — ED Provider Notes (Signed)
[facility] EMERGENCY DEPARTMENT Provider Note   CSN: [id number] Arrival date & time: 10/29/17  1707  History   Chief Complaint Chief Complaint  Patient presents with  . Cholelithiasis  . Abdominal Pain    HPI Steven Miles is Miles 47 y.o. male.  HPI Patient is Miles 47 year old male with history of cholelithiasis who presents emergency department today for evaluation of recurrent pain in his right upper quadrant as well as worsening transaminitis and elevated bilirubin.  Was seen today in preoperative clinic and had labs that states were elevated.  States after returning home he was called and advised to come to the emergency department by his general surgery office staff due to lab elevations.  Patient reports that his pain was better over the past week however today he has had recurrent episodes of 8/10 pain.  Currently 4/10. Aching/cramping and episodic.  Did improve some with hydrocodone that he was prescribed at last discharge.No fevers. Has had Miles couple episodes of nonbloody/nonbilious emesis.  No diarrhea or blood in stool.  Past Medical History:  Diagnosis Date  . Gallstones   . Medical history non-contributory     Patient Active Problem List   Diagnosis Date Noted  . Cholecystitis 10/29/2017    Past Surgical History:  Procedure Laterality Date  . VASECTOMY          Home Medications    Prior to Admission medications   Medication Sig Start Date End Date Taking? Authorizing Provider  HYDROcodone-acetaminophen (NORCO/VICODIN) 5-325 MG tablet Take 1-2 tablets by mouth every 6 (six) hours as needed. Patient taking differently: Take 1-2 tablets by mouth every 6 (six) hours as needed (pain).  10/20/17  Yes Steven HorsemanBrowning, Robert, PA-C  ondansetron (ZOFRAN ODT) 4 MG disintegrating tablet Take 1 tablet (4 mg total) by mouth every 8 (eight) hours as needed for nausea or vomiting. 10/20/17  Yes Steven HorsemanBrowning, Robert, PA-C  pantoprazole (PROTONIX) 40 MG tablet Take 1 tablet (40  mg total) by mouth daily. Patient taking differently: Take 40 mg by mouth daily as needed (acid reflux).  09/24/17  Yes Steven BosworthStallings, Zoe A, MD  PRESCRIPTION MEDICATION Apply 1 application topically daily as needed (psoriasis). Cream for psoriasis   Yes [provider]  traMADol (ULTRAM) 50 MG tablet Take 1 tablet (50 mg total) by mouth every 8 (eight) hours as needed. Patient not taking: Reported on 10/20/2017 09/24/17   Steven BosworthStallings, Zoe A, MD    Family History Family History  Problem Relation Age of Onset  . Asthma Mother   . Depression Mother   . Thyroid disease Mother   . Diabetes Father   . Hypertension Father   . Hyperlipidemia Brother     Social History Social History   Tobacco Use  . Smoking status: Never Smoker  . Smokeless tobacco: Never Used  . Tobacco comment: occasional cigar while playing golf  Substance Use Topics  . Alcohol use: No    Alcohol/week: 0.0 oz  . Drug use: No     Allergies   Patient has no known allergies.   Review of Systems Review of Systems  Constitutional: Negative for chills and fever.  HENT: Negative for congestion.   Respiratory: Negative for cough and shortness of breath.   Cardiovascular: Negative for chest pain and leg swelling.  Gastrointestinal: Positive for abdominal pain, nausea and vomiting. Negative for blood in stool and diarrhea.  Genitourinary: Negative for dysuria and hematuria.  Musculoskeletal: Positive for back pain (bilat flank pain). Negative for neck  pain.  Skin: Negative for rash.  Neurological: Negative for weakness, numbness and headaches.     Physical Exam Updated Vital Signs BP 137/78   Pulse (!) 48   Temp 97.8 F (36.6 C) (Oral)   Resp 16   SpO2 100%   Physical Exam  Constitutional: No distress.  HENT:  Head: Normocephalic and atraumatic.  Eyes: Conjunctivae are normal. Right eye exhibits no discharge. Left eye exhibits no discharge.  Neck: Normal range of motion. No tracheal deviation present.    Cardiovascular: Regular rhythm, normal heart sounds and intact distal pulses.  Pulmonary/Chest: Effort normal and breath sounds normal. No respiratory distress.  Abdominal: Soft. Bowel sounds are normal. He exhibits no distension. There is tenderness (RUQ).  Musculoskeletal: He exhibits no edema or deformity.  Neurological: He is alert. He exhibits normal muscle tone.  Skin: No rash noted. He is not diaphoretic.  Psychiatric: He has Miles normal mood and affect.    ED Treatments / Results  Labs (all labs ordered are listed, but only abnormal results are displayed) Labs Reviewed  LIPASE, BLOOD    EKG None  Radiology No results found.  Procedures Procedures (including critical care time)  Medications Ordered in ED Medications  lactated ringers bolus 1,000 mL (0 mLs Intravenous Stopped 10/29/17 1933)  morphine 4 MG/ML injection 4 mg (4 mg Intravenous Given 10/29/17 1828)  ondansetron (ZOFRAN) injection 4 mg (4 mg Intravenous Given 10/29/17 1829)     Initial Impression / Assessment and Plan / ED Course  I have reviewed the triage vital signs and the nursing notes.  Pertinent labs & imaging results that were available during my care of the patient were reviewed by me and considered in my medical decision making (see chart for details).    Patient is Miles 47 year old male with history of cholelithiasis who presents emergency department today for evaluation of recurrent pain in his right upper quadrant as well as worsening transaminitis and elevated bilirubin.    Patient afebrile, hemodynamically stable.  History & exam as above.  Does appear uncomfortable.  IV morphine given with improvement in pain.  Zofran also given.  Reviewed labs from preop visit today.  CBC unremarkable.  No leukocytosis.  CMP is remarkable for worsening transaminitis is more than doubled since last visit.  T bili also elevated 2.9 up from 0.9 on last visit. Lipase ordered.  Discussed with surgery team who evaluated in  the emergency department.  Will admit for MRCP tonight to evaluate for choledocholithiasis and possible lap chole for possible cholecystitis/symptomatic cholelithiasis (stones on prior CT/US). Pt and wife agreeable with plan. Stable in ED w/no acute events. Stable time of transfer to inpt room.   Case and plan of care discussed with Dr. Rhunette Croft.   Final Clinical Impressions(s) / ED Diagnoses   Final diagnoses:  Transaminitis  Gallstones  RUQ abdominal pain    ED Discharge Orders    None       Rigoberto Noel, MD 10/29/17 Babette Relic    Derwood Kaplan, MD 10/31/17 1246

## 2017-10-29 NOTE — Progress Notes (Signed)
Patient arrived to unit. Alert and oriented x4. MD orders received with patient.  Wife at bedside.

## 2017-10-29 NOTE — ED Triage Notes (Signed)
Pt was called this afternoon after having preop appointment and blood work for abnormal liver enzymes and told to come back to the ER. Pt schedule to have gallbladder removed in 3 days. VSS.

## 2017-10-29 NOTE — Progress Notes (Signed)
Pt denies cardiac history, diabetes or HTN. During PAT appt for galbladder surgery pt states his pain in his upper mid abdomen was beginning to get worse. Pt was beginning to squirm in seat, had to walk around and couldn't get comfortable. He stated that lying down usually helped. Had him lay down on a stretcher and after about 15 minutes began to feel better. Pt was able to walk out on his own. Instructed pt that if pain continues to get worse and he can't get it under control at home, to go to the ED. Pt voiced understanding.

## 2017-10-29 NOTE — H&P (Addendum)
Steven Miles is an 47 y.o. male.   Chief Complaint: epigastric pain/ruq pain HPI: 47 yom with couple months of epigastric and ruq pain associated with eating.  He had us on 7/1 with stones. Was treated for pud by urgent care. Had more pain and more frequent. Back to er 7/27 with mild elevated transaminases.  Us and ct done. Has stones. Referred to our office where he saw surgery and was actually scheduled this Thursday. Today he has worse pain, not going away, nothing making better, some nausea. Was noted on preop labs to have elevated tb, ast and alt and was referred to the er. He is [employer] and is here with his wife.  Lipase found to be elevated also.  Past Medical History:  Diagnosis Date  . Gallstones   . Medical history non-contributory     Past Surgical History:  Procedure Laterality Date  . VASECTOMY      Family History  Problem Relation Age of Onset  . Asthma Mother   . Depression Mother   . Thyroid disease Mother   . Diabetes Father   . Hypertension Father   . Hyperlipidemia Brother    Social History:  reports that he has never smoked. He has never used smokeless tobacco. He reports that he does not drink alcohol or use drugs.  Allergies: No Known Allergies  meds none  Results for orders placed or performed during the hospital encounter of 10/29/17 (from the past 48 hour(s))  Comprehensive metabolic panel     Status: Abnormal   Collection Time: 10/29/17  8:10 AM  Result Value Ref Range   Sodium 138 135 - 145 mmol/L   Potassium 4.1 3.5 - 5.1 mmol/L   Chloride 101 98 - 111 mmol/L   CO2 28 22 - 32 mmol/L   Glucose, Bld 129 (H) 70 - 99 mg/dL   BUN 15 6 - 20 mg/dL   Creatinine, Ser 1.26 (H) 0.61 - 1.24 mg/dL   Calcium 9.6 8.9 - 10.3 mg/dL   Total Protein 7.4 6.5 - 8.1 g/dL   Albumin 4.1 3.5 - 5.0 g/dL   AST 485 (H) 15 - 41 U/L   ALT 838 (H) 0 - 44 U/L   Alkaline Phosphatase 294 (H) 38 - 126 U/L   Total Bilirubin 2.9 (H) 0.3 - 1.2 mg/dL   GFR calc non Af  Amer >60 >60 mL/min   GFR calc Af Amer >60 >60 mL/min    Comment: (NOTE) The eGFR has been calculated using the CKD EPI equation. This calculation has not been validated in all clinical situations. eGFR's persistently <60 mL/min signify possible Chronic Kidney Disease.    Anion gap 9 5 - 15    Comment: Performed at [facility], [address] [place], [place] [address]  CBC     Status: None   Collection Time: 10/29/17  8:10 AM  Result Value Ref Range   WBC 9.0 4.0 - 10.5 K/uL   RBC 5.13 4.22 - 5.81 MIL/uL   Hemoglobin 15.8 13.0 - 17.0 g/dL   HCT 47.7 39.0 - 52.0 %   MCV 93.0 78.0 - 100.0 fL   MCH 30.8 26.0 - 34.0 pg   MCHC 33.1 30.0 - 36.0 g/dL   RDW 12.7 11.5 - 15.5 %   Platelets 275 150 - 400 K/uL    Comment: Performed at [facility], [address] [place], [place] [address]   No results found.  Review of Systems    Gastrointestinal: Positive for abdominal pain and nausea.  All other systems reviewed and are negative.   Blood pressure 126/90, pulse (!) 46, temperature 97.8 F (36.6 C), temperature source Oral, resp. rate 16, SpO2 98 %. Physical Exam  Vitals reviewed. Constitutional: He is oriented to person, place, and time. He appears well-developed and well-nourished.  HENT:  Head: Normocephalic and atraumatic.  Right Ear: External ear normal.  Left Ear: External ear normal.  Eyes: Pupils are equal, round, and reactive to light. EOM are normal. No scleral icterus.  Neck: Neck supple.  Cardiovascular: Normal rate, regular rhythm, normal heart sounds and intact distal pulses.  Respiratory: Effort normal and breath sounds normal.  GI: Soft. Bowel sounds are normal. There is tenderness in the right upper quadrant and epigastric area. There is negative Murphy's sign. No hernia.  Musculoskeletal: Normal range of motion.  Neurological: He is alert and oriented to person, place, and time.  Skin: Skin is warm and dry. He is not diaphoretic.  Psychiatric: His  behavior is normal. Thought content normal.     Assessment/Plan Gallstone pancreatitis  Lipase finally came back and was elevated.  Will admit , recheck labs in am and plan lap chole once  Resolved.    Rolm Bookbinder, MD 10/29/2017, 7:03 PM

## 2017-10-30 ENCOUNTER — Other Ambulatory Visit: Payer: Self-pay

## 2017-10-30 LAB — CBC
HCT: 42.7 % (ref 39.0–52.0)
Hemoglobin: 14.3 g/dL (ref 13.0–17.0)
MCH: 31.2 pg (ref 26.0–34.0)
MCHC: 33.5 g/dL (ref 30.0–36.0)
MCV: 93 fL (ref 78.0–100.0)
PLATELETS: 241 10*3/uL (ref 150–400)
RBC: 4.59 MIL/uL (ref 4.22–5.81)
RDW: 12.7 % (ref 11.5–15.5)
WBC: 9.1 10*3/uL (ref 4.0–10.5)

## 2017-10-30 LAB — COMPREHENSIVE METABOLIC PANEL
ALT: 665 U/L — AB (ref 0–44)
AST: 292 U/L — AB (ref 15–41)
Albumin: 3.6 g/dL (ref 3.5–5.0)
Alkaline Phosphatase: 265 U/L — ABNORMAL HIGH (ref 38–126)
Anion gap: 10 (ref 5–15)
BUN: 9 mg/dL (ref 6–20)
CHLORIDE: 102 mmol/L (ref 98–111)
CO2: 28 mmol/L (ref 22–32)
CREATININE: 1.2 mg/dL (ref 0.61–1.24)
Calcium: 8.9 mg/dL (ref 8.9–10.3)
GFR calc Af Amer: >60 mL/min (ref >60)
GFR calc non Af Amer: >60 mL/min (ref >60)
GLUCOSE: 99 mg/dL (ref 70–99)
Potassium: 4.7 mmol/L (ref 3.5–5.1)
SODIUM: 140 mmol/L (ref 135–145)
Total Bilirubin: 1.9 mg/dL — ABNORMAL HIGH (ref 0.3–1.2)
Total Protein: 6.5 g/dL (ref 6.5–8.1)

## 2017-10-30 LAB — LIPASE, BLOOD: Lipase: 1125 U/L — ABNORMAL HIGH (ref 11–51)

## 2017-10-30 MED ORDER — SODIUM CHLORIDE 0.9 % IV SOLN
2.0000 g | INTRAVENOUS | Status: DC
  Administered 2017-10-30 – 2017-10-31 (×2): 2 g via INTRAVENOUS
  Filled 2017-10-30 (×3): qty 20

## 2017-10-30 MED ORDER — HYDROMORPHONE HCL 1 MG/ML IJ SOLN: 0.5000 mg | INTRAMUSCULAR | Status: DC | PRN

## 2017-10-30 MED ORDER — HYDROMORPHONE HCL 1 MG/ML IJ SOLN
0.5000 mg | INTRAMUSCULAR | Status: DC | PRN
  Administered 2017-10-30: 1 mg via INTRAVENOUS
  Filled 2017-10-30: qty 1

## 2017-10-30 NOTE — Progress Notes (Signed)
Patient concerned that is MRCP was cancelled.  Paged Dr Dwain SarnaWakefield. Call back received from Dr Dwain SarnaWakefield. MD informed me to let patient know he has pancreatitis that is why his MRCP was cancelled. Patient and wife made aware that MRCP was cancelled by MD due to pancreatitis. MD also informed me to let patient know he will be seen during AM rounds. Patient made aware. However patient got frustrated and demanded to speak to Dr Dwain SarnaWakefield. Dr Dwain SarnaWakefield called again and informed patient is requesting to speak with him. I received a call back and MD informed me patient will be seen during AM rounds due to trauma case in ED. I also clarified with MD per patient request if surgery will be done today. Dr Dwain SarnaWakefield stated " no pancreatitis will have to resolve first" . Patient and wife made aware that MD will make rounds this morning and speak with him for further clarifications.

## 2017-10-30 NOTE — Progress Notes (Signed)
Patient called nurse to room complaining pain not relieved by 2mg  of morphine . Dr Dwain SarnaWakefield paged and informed pain is not relieved with morphine,  awaiting response.

## 2017-10-30 NOTE — Progress Notes (Signed)
[facility] Progress Note     Subjective: CC:  Wife at bedside. Pt frustrated with the care he has received last couple weeks. We discussed the pathophysiology of biliary colic, CBD stones, and gallstone pancreatitis in detail. He endorses intermittent 9/10 epigastric pain with radiation to his back, improved with IV pain meds. Urinating without symptoms. +flatus. Last BM 2 days ago and "normal", non-bloody.   Objective: Vital signs in last 24 hours: Temp:  [97.8 F (36.6 C)-97.9 F (36.6 C)] 97.9 F (36.6 C) (08/06 0359) Pulse Rate:  [40-50] 50 (08/06 0359) Resp:  [15-16] 16 (08/06 0359) BP: (110-142)/(70-98) 112/70 (08/06 0359) SpO2:  [95 %-100 %] 97 % (08/06 0359) Weight:  [74.1 kg (163 lb 6 oz)] 74.1 kg (163 lb 6 oz) (08/05 0758) Last BM Date: 10/29/17  Intake/Output from previous day: 08/05 0701 - 08/06 0700 In: 1240 [P.O.:240; IV Piggyback:1000] Out: -  Intake/Output this shift: No intake/output data recorded.  PE: Gen:  Alert, NAD, cooperative  Card:  Regular rate and rhythm, pedal pulses 2+ BL Pulm:  Normal effort, clear to auscultation bilaterally Abd: Soft, mild epigastric tenderness, mild distention, hypoactive bowel sounds Skin: warm and dry, no rashes  Psych: A&Ox3   Lab Results:  Recent Labs    10/29/17 0810 10/30/17 0425  WBC 9.0 9.1  HGB 15.8 14.3  HCT 47.7 42.7  PLT 275 241   BMET Recent Labs    10/29/17 0810 10/30/17 0425  NA 138 140  K 4.1 4.7  CL 101 102  CO2 28 28  GLUCOSE 129* 99  BUN 15 9  CREATININE 1.26* 1.20  CALCIUM 9.6 8.9   PT/INR No results for input(s): LABPROT, INR in the last 72 hours. CMP     Component Value Date/Time   NA 140 10/30/2017 0425   NA 135 09/24/2017 1203   K 4.7 10/30/2017 0425   CL 102 10/30/2017 0425   CO2 28 10/30/2017 0425   GLUCOSE 99 10/30/2017 0425   BUN 9 10/30/2017 0425   BUN 14 09/24/2017 1203   CREATININE 1.20 10/30/2017 0425   CREATININE 1.16 11/18/2015 1206   CALCIUM 8.9  10/30/2017 0425   PROT 6.5 10/30/2017 0425   PROT 7.2 09/24/2017 1203   ALBUMIN 3.6 10/30/2017 0425   ALBUMIN 4.6 09/24/2017 1203   AST 292 (H) 10/30/2017 0425   ALT 665 (H) 10/30/2017 0425   ALKPHOS 265 (H) 10/30/2017 0425   BILITOT 1.9 (H) 10/30/2017 0425   BILITOT 0.2 09/24/2017 1203   GFRNONAA >60 10/30/2017 0425   GFRAA >60 10/30/2017 0425   Lipase     Component Value Date/Time   LIPASE 2,082 (H) 10/29/2017 1925       Studies/Results: No results found.  Anti-infectives: Anti-infectives (From admission, onward)   Start     Dose/Rate Route Frequency Ordered Stop   10/29/17 2100  cefTRIAXone (ROCEPHIN) 2 g in sodium chloride 0.9 % 100 mL IVPB  Status:  Discontinued     2 g 200 mL/hr over 30 Minutes Intravenous Every 24 hours 10/29/17 2005 10/30/17 0506     Assessment/Plan gallstone pancreatitis  - afebrile, VSS, WBC WNL  - lipase 2,082 on 8/5, repeat today pending  - continue IVF, bowel rest   Elevated LFT's - improving, total bilirubin trending downward  FEN: NPO, IVF ID: Rocephin 8/6>> VTE: SCD's, Lovenox  Plan: bowel rest, repeat CMET and lipase in AM laparoscopic cholecystectomy once acute pancreatitis resolved.    LOS: 1 day  Adam Phenix , [facility] 10/30/2017, 7:50 AM Pager: [phone] Consults: [phone] Mon-Fri 7:00 am-4:30 pm Sat-Sun 7:00 am-11:30 am

## 2017-10-31 ENCOUNTER — Encounter (HOSPITAL_COMMUNITY): Payer: Self-pay | Admitting: Anesthesiology

## 2017-10-31 LAB — COMPREHENSIVE METABOLIC PANEL
ALBUMIN: 3.2 g/dL — AB (ref 3.5–5.0)
ALK PHOS: 363 U/L — AB (ref 38–126)
ALT: 615 U/L — AB (ref 0–44)
AST: 297 U/L — ABNORMAL HIGH (ref 15–41)
Anion gap: 13 (ref 5–15)
BUN: 11 mg/dL (ref 6–20)
CALCIUM: 8.4 mg/dL — AB (ref 8.9–10.3)
CO2: 17 mmol/L — AB (ref 22–32)
CREATININE: 1.14 mg/dL (ref 0.61–1.24)
Chloride: 104 mmol/L (ref 98–111)
GFR calc Af Amer: >60 mL/min (ref >60)
GFR calc non Af Amer: >60 mL/min (ref >60)
GLUCOSE: 72 mg/dL (ref 70–99)
Potassium: 4.6 mmol/L (ref 3.5–5.1)
Sodium: 134 mmol/L — ABNORMAL LOW (ref 135–145)
Total Bilirubin: 1.8 mg/dL — ABNORMAL HIGH (ref 0.3–1.2)
Total Protein: 6.2 g/dL — ABNORMAL LOW (ref 6.5–8.1)

## 2017-10-31 LAB — LIPASE, BLOOD: Lipase: 540 U/L — ABNORMAL HIGH (ref 11–51)

## 2017-10-31 LAB — SURGICAL PCR SCREEN
MRSA, PCR: NEGATIVE
Staphylococcus aureus: NEGATIVE

## 2017-10-31 MED ORDER — GABAPENTIN 300 MG PO CAPS: 300.0000 mg | ORAL_CAPSULE | ORAL | Status: DC

## 2017-10-31 MED ORDER — ACETAMINOPHEN 500 MG PO TABS: 1000.0000 mg | ORAL_TABLET | ORAL | Status: DC

## 2017-10-31 MED ORDER — SODIUM CHLORIDE 0.9 % IV SOLN
2.0000 g | INTRAVENOUS | Status: DC
  Filled 2017-10-31: qty 2

## 2017-10-31 NOTE — Anesthesia Preprocedure Evaluation (Addendum)
Anesthesia Evaluation  Patient identified by MRN, date of birth, ID band Patient awake    Reviewed: Allergy & Precautions, NPO status , Patient's Chart, lab work & pertinent test results  Airway Mallampati: I       Dental no notable dental hx. (+) Teeth Intact   Pulmonary neg pulmonary ROS,    Pulmonary exam normal breath sounds clear to auscultation       Cardiovascular negative cardio ROS Normal cardiovascular exam Rhythm:Regular Rate:Normal     Neuro/Psych negative neurological ROS  negative psych ROS   GI/Hepatic negative GI ROS, Neg liver ROS,   Endo/Other  negative endocrine ROS  Renal/GU negative Renal ROS  negative genitourinary   Musculoskeletal negative musculoskeletal ROS (+)   Abdominal Normal abdominal exam  (+)   Peds  Hematology negative hematology ROS (+)   Anesthesia Other Findings   Reproductive/Obstetrics                            Anesthesia Physical Anesthesia Plan  ASA: I  Anesthesia Plan: General   Post-op Pain Management:    Induction: Intravenous  PONV Risk Score and Plan: 4 or greater and Ondansetron, Dexamethasone and Scopolamine patch - Pre-op  Airway Management Planned: Oral ETT  Additional Equipment:   Intra-op Plan:   Post-operative Plan: Extubation in OR  Informed Consent: I have reviewed the patients History and Physical, chart, labs and discussed the procedure including the risks, benefits and alternatives for the proposed anesthesia with the patient or authorized representative who has indicated his/her understanding and acceptance.     Plan Discussed with: CRNA and Surgeon  Anesthesia Plan Comments:        Anesthesia Quick Evaluation

## 2017-10-31 NOTE — Progress Notes (Addendum)
[facility] Progress Note     Subjective: CC:  Denies abdominal pain. Had a BM. denies nausea or vomiting.   Objective: Vital signs in last 24 hours: Temp:  [98 F (36.7 C)-99.1 F (37.3 C)] 98.7 F (37.1 C) (08/07 0524) Pulse Rate:  [47-60] 60 (08/07 0524) Resp:  [13-18] 16 (08/07 0524) BP: (110-124)/(65-74) 124/74 (08/07 0524) SpO2:  [95 %-99 %] 98 % (08/07 0524) Last BM Date: 10/29/17  Intake/Output from previous day: 08/06 0701 - 08/07 0700 In: 1659.2 [I.V.:1559.2; IV Piggyback:100] Out: -  Intake/Output this shift: No intake/output data recorded.  PE: Gen:  Alert, NAD, cooperative and pleasant Card:  Regular rate and rhythm, pedal pulses 2+ BL  Pulm:  Normal effort, clear to auscultation bilaterally Abd: Soft, mild, mostly subjective epigastric tenderness, nondistended, +BS Skin: warm and dry, no rashes  Psych: A&Ox3    Lab Results:  Recent Labs    10/29/17 0810 10/30/17 0425  WBC 9.0 9.1  HGB 15.8 14.3  HCT 47.7 42.7  PLT 275 241   BMET Recent Labs    10/30/17 0425 10/31/17 0525  NA 140 134*  K 4.7 4.6  CL 102 104  CO2 28 17*  GLUCOSE 99 72  BUN 9 11  CREATININE 1.20 1.14  CALCIUM 8.9 8.4*   PT/INR No results for input(s): LABPROT, INR in the last 72 hours. CMP     Component Value Date/Time   NA 134 (L) 10/31/2017 0525   NA 135 09/24/2017 1203   K 4.6 10/31/2017 0525   CL 104 10/31/2017 0525   CO2 17 (L) 10/31/2017 0525   GLUCOSE 72 10/31/2017 0525   BUN 11 10/31/2017 0525   BUN 14 09/24/2017 1203   CREATININE 1.14 10/31/2017 0525   CREATININE 1.16 11/18/2015 1206   CALCIUM 8.4 (L) 10/31/2017 0525   PROT 6.2 (L) 10/31/2017 0525   PROT 7.2 09/24/2017 1203   ALBUMIN 3.2 (L) 10/31/2017 0525   ALBUMIN 4.6 09/24/2017 1203   AST 297 (H) 10/31/2017 0525   ALT 615 (H) 10/31/2017 0525   ALKPHOS 363 (H) 10/31/2017 0525   BILITOT 1.8 (H) 10/31/2017 0525   BILITOT 0.2 09/24/2017 1203   GFRNONAA >60 10/31/2017 0525   GFRAA  >60 10/31/2017 0525   Lipase     Component Value Date/Time   LIPASE 540 (H) 10/31/2017 0525       Studies/Results: No results found.  Anti-infectives: Anti-infectives (From admission, onward)   Start     Dose/Rate Route Frequency Ordered Stop   10/30/17 0930  cefTRIAXone (ROCEPHIN) 2 g in sodium chloride 0.9 % 100 mL IVPB    Note to Pharmacy:  Pharmacy may adjust dosing strength / duration / interval for maximal efficacy   2 g 200 mL/hr over 30 Minutes Intravenous Every 24 hours 10/30/17 0845     10/29/17 2100  cefTRIAXone (ROCEPHIN) 2 g in sodium chloride 0.9 % 100 mL IVPB  Status:  Discontinued     2 g 200 mL/hr over 30 Minutes Intravenous Every 24 hours 10/29/17 2005 10/30/17 0506     Assessment/Plan gallstone pancreatitis  - afebrile, VSS, WBC WNL  - lipase 2,082 >> 1,125 >> 540 today - continue IVF, bowel rest, limited ice chips and sips are ok  Elevated LFT's - stable; still elevated AST 297, ALT 615, AP 363, t. Bili 1.8   FEN: NPO w/ sips/chips, IVF ID: Rocephin 8/6>> VTE: SCD's, Lovenox  Plan: bowel rest, repeat CMET and lipase in AM laparoscopic cholecystectomy  once acute pancreatitis resolved, possibly tomorrow.     LOS: 2 days    Adam Phenix , [facility] 10/31/2017, 8:25 AM Pager: [phone] Consults: [phone] Mon-Fri 7:00 am-4:30 pm Sat-Sun 7:00 am-11:30 am

## 2017-10-31 NOTE — H&P (View-Only) (Signed)
[facility] Progress Note     Subjective: CC:  Denies abdominal pain. Had a BM. denies nausea or vomiting.   Objective: Vital signs in last 24 hours: Temp:  [98 F (36.7 C)-99.1 F (37.3 C)] 98.7 F (37.1 C) (08/07 0524) Pulse Rate:  [47-60] 60 (08/07 0524) Resp:  [13-18] 16 (08/07 0524) BP: (110-124)/(65-74) 124/74 (08/07 0524) SpO2:  [95 %-99 %] 98 % (08/07 0524) Last BM Date: 10/29/17  Intake/Output from previous day: 08/06 0701 - 08/07 0700 In: 1659.2 [I.V.:1559.2; IV Piggyback:100] Out: -  Intake/Output this shift: No intake/output data recorded.  PE: Gen:  Alert, NAD, cooperative and pleasant Card:  Regular rate and rhythm, pedal pulses 2+ BL  Pulm:  Normal effort, clear to auscultation bilaterally Abd: Soft, mild, mostly subjective epigastric tenderness, nondistended, +BS Skin: warm and dry, no rashes  Psych: A&Ox3    Lab Results:  Recent Labs    10/29/17 0810 10/30/17 0425  WBC 9.0 9.1  HGB 15.8 14.3  HCT 47.7 42.7  PLT 275 241   BMET Recent Labs    10/30/17 0425 10/31/17 0525  NA 140 134*  K 4.7 4.6  CL 102 104  CO2 28 17*  GLUCOSE 99 72  BUN 9 11  CREATININE 1.20 1.14  CALCIUM 8.9 8.4*   PT/INR No results for input(s): LABPROT, INR in the last 72 hours. CMP     Component Value Date/Time   NA 134 (L) 10/31/2017 0525   NA 135 09/24/2017 1203   K 4.6 10/31/2017 0525   CL 104 10/31/2017 0525   CO2 17 (L) 10/31/2017 0525   GLUCOSE 72 10/31/2017 0525   BUN 11 10/31/2017 0525   BUN 14 09/24/2017 1203   CREATININE 1.14 10/31/2017 0525   CREATININE 1.16 11/18/2015 1206   CALCIUM 8.4 (L) 10/31/2017 0525   PROT 6.2 (L) 10/31/2017 0525   PROT 7.2 09/24/2017 1203   ALBUMIN 3.2 (L) 10/31/2017 0525   ALBUMIN 4.6 09/24/2017 1203   AST 297 (H) 10/31/2017 0525   ALT 615 (H) 10/31/2017 0525   ALKPHOS 363 (H) 10/31/2017 0525   BILITOT 1.8 (H) 10/31/2017 0525   BILITOT 0.2 09/24/2017 1203   GFRNONAA >60 10/31/2017 0525   GFRAA  >60 10/31/2017 0525   Lipase     Component Value Date/Time   LIPASE 540 (H) 10/31/2017 0525       Studies/Results: No results found.  Anti-infectives: Anti-infectives (From admission, onward)   Start     Dose/Rate Route Frequency Ordered Stop   10/30/17 0930  cefTRIAXone (ROCEPHIN) 2 g in sodium chloride 0.9 % 100 mL IVPB    Note to Pharmacy:  Pharmacy may adjust dosing strength / duration / interval for maximal efficacy   2 g 200 mL/hr over 30 Minutes Intravenous Every 24 hours 10/30/17 0845     10/29/17 2100  cefTRIAXone (ROCEPHIN) 2 g in sodium chloride 0.9 % 100 mL IVPB  Status:  Discontinued     2 g 200 mL/hr over 30 Minutes Intravenous Every 24 hours 10/29/17 2005 10/30/17 0506     Assessment/Plan gallstone pancreatitis  - afebrile, VSS, WBC WNL  - lipase 2,082 >> 1,125 >> 540 today - continue IVF, bowel rest, limited ice chips and sips are ok  Elevated LFT's - stable; still elevated AST 297, ALT 615, AP 363, t. Bili 1.8   FEN: NPO w/ sips/chips, IVF ID: Rocephin 8/6>> VTE: SCD's, Lovenox  Plan: bowel rest, repeat CMET and lipase in AM laparoscopic cholecystectomy   once acute pancreatitis resolved, possibly tomorrow.     LOS: 2 days    Lizmarie Witters S Wilmore Holsomback , PA-C [facility] 10/31/2017, 8:25 AM Pager: [phone] Consults: [phone] Mon-Fri 7:00 am-4:30 pm Sat-Sun 7:00 am-11:30 am   

## 2017-11-01 ENCOUNTER — Inpatient Hospital Stay (HOSPITAL_COMMUNITY): Payer: BC Managed Care – PPO | Admitting: Anesthesiology

## 2017-11-01 ENCOUNTER — Ambulatory Visit (HOSPITAL_COMMUNITY): Admission: RE | Admit: 2017-11-01 | Payer: BC Managed Care – PPO | Source: Ambulatory Visit | Admitting: General Surgery

## 2017-11-01 ENCOUNTER — Inpatient Hospital Stay (HOSPITAL_COMMUNITY): Payer: BC Managed Care – PPO | Admitting: Physician Assistant

## 2017-11-01 ENCOUNTER — Inpatient Hospital Stay (HOSPITAL_COMMUNITY): Payer: BC Managed Care – PPO

## 2017-11-01 ENCOUNTER — Encounter (HOSPITAL_COMMUNITY): Admission: EM | Disposition: A | Discharge status: Home / Self Care | Payer: Self-pay | Source: Home / Self Care

## 2017-11-01 HISTORY — PX: CHOLECYSTECTOMY: SHX55

## 2017-11-01 LAB — COMPREHENSIVE METABOLIC PANEL
ALBUMIN: 3.2 g/dL — AB (ref 3.5–5.0)
ALT: 440 U/L — ABNORMAL HIGH (ref 0–44)
ANION GAP: 12 (ref 5–15)
AST: 140 U/L — ABNORMAL HIGH (ref 15–41)
Alkaline Phosphatase: 298 U/L — ABNORMAL HIGH (ref 38–126)
BUN: 10 mg/dL (ref 6–20)
CALCIUM: 8.8 mg/dL — AB (ref 8.9–10.3)
CHLORIDE: 108 mmol/L (ref 98–111)
CO2: 17 mmol/L — AB (ref 22–32)
Creatinine, Ser: 1.08 mg/dL (ref 0.61–1.24)
GFR calc non Af Amer: >60 mL/min (ref >60)
GLUCOSE: 61 mg/dL — AB (ref 70–99)
Potassium: 4.4 mmol/L (ref 3.5–5.1)
SODIUM: 137 mmol/L (ref 135–145)
Total Bilirubin: 1.9 mg/dL — ABNORMAL HIGH (ref 0.3–1.2)
Total Protein: 6.2 g/dL — ABNORMAL LOW (ref 6.5–8.1)

## 2017-11-01 LAB — LIPASE, BLOOD: Lipase: 99 U/L — ABNORMAL HIGH (ref 11–51)

## 2017-11-01 SURGERY — LAPAROSCOPIC CHOLECYSTECTOMY WITH INTRAOPERATIVE CHOLANGIOGRAM
Anesthesia: General | Site: Abdomen

## 2017-11-01 MED ORDER — FENTANYL CITRATE (PF) 250 MCG/5ML IJ SOLN
INTRAMUSCULAR | Status: DC | PRN
  Administered 2017-11-01: 150 ug via INTRAVENOUS
  Administered 2017-11-01: 100 ug via INTRAVENOUS

## 2017-11-01 MED ORDER — OXYCODONE HCL 5 MG PO TABS
5.0000 mg | ORAL_TABLET | ORAL | Status: DC | PRN
  Administered 2017-11-01: 5 mg via ORAL

## 2017-11-01 MED ORDER — KETOROLAC TROMETHAMINE 30 MG/ML IJ SOLN
INTRAMUSCULAR | Status: DC | PRN
  Administered 2017-11-01: 30 mg via INTRAVENOUS

## 2017-11-01 MED ORDER — ROCURONIUM BROMIDE 10 MG/ML (PF) SYRINGE
PREFILLED_SYRINGE | INTRAVENOUS | Status: AC
Start: 2017-11-01 — End: ?
  Filled 2017-11-01: qty 20

## 2017-11-01 MED ORDER — BUPIVACAINE-EPINEPHRINE 0.25% -1:200000 IJ SOLN
INTRAMUSCULAR | Status: DC | PRN
  Administered 2017-11-01: 30 mL

## 2017-11-01 MED ORDER — ROCURONIUM BROMIDE 10 MG/ML (PF) SYRINGE
PREFILLED_SYRINGE | INTRAVENOUS | Status: DC | PRN
  Administered 2017-11-01: 50 mg via INTRAVENOUS
  Administered 2017-11-01: 15 mg via INTRAVENOUS

## 2017-11-01 MED ORDER — ONDANSETRON HCL 4 MG/2ML IJ SOLN
INTRAMUSCULAR | Status: DC | PRN
  Administered 2017-11-01: 4 mg via INTRAVENOUS

## 2017-11-01 MED ORDER — ONDANSETRON HCL 4 MG/2ML IJ SOLN
INTRAMUSCULAR | Status: AC
  Filled 2017-11-01: qty 2

## 2017-11-01 MED ORDER — BUPIVACAINE-EPINEPHRINE (PF) 0.25% -1:200000 IJ SOLN
INTRAMUSCULAR | Status: AC
  Filled 2017-11-01: qty 30

## 2017-11-01 MED ORDER — MIDAZOLAM HCL 5 MG/5ML IJ SOLN
INTRAMUSCULAR | Status: DC | PRN
  Administered 2017-11-01: 2 mg via INTRAVENOUS

## 2017-11-01 MED ORDER — DEXAMETHASONE SODIUM PHOSPHATE 10 MG/ML IJ SOLN
INTRAMUSCULAR | Status: AC
  Filled 2017-11-01: qty 1

## 2017-11-01 MED ORDER — MEPERIDINE HCL 50 MG/ML IJ SOLN: 6.2500 mg | INTRAMUSCULAR | Status: DC | PRN

## 2017-11-01 MED ORDER — DOCUSATE SODIUM 100 MG PO CAPS
100.0000 mg | ORAL_CAPSULE | Freq: Two times a day (BID) | ORAL | Status: DC
  Administered 2017-11-01 – 2017-11-02 (×3): 100 mg via ORAL
  Filled 2017-11-01 (×3): qty 1

## 2017-11-01 MED ORDER — FENTANYL CITRATE (PF) 250 MCG/5ML IJ SOLN
INTRAMUSCULAR | Status: AC
Start: 2017-11-01 — End: ?
  Filled 2017-11-01: qty 5

## 2017-11-01 MED ORDER — PANTOPRAZOLE SODIUM 40 MG PO TBEC
40.0000 mg | DELAYED_RELEASE_TABLET | Freq: Every day | ORAL | Status: DC
  Administered 2017-11-01 – 2017-11-02 (×2): 40 mg via ORAL
  Filled 2017-11-01 (×2): qty 1

## 2017-11-01 MED ORDER — HYDROMORPHONE HCL 1 MG/ML IJ SOLN
INTRAMUSCULAR | Status: AC
  Administered 2017-11-01: 0.5 mg via INTRAVENOUS
  Filled 2017-11-01: qty 1

## 2017-11-01 MED ORDER — CHLORHEXIDINE GLUCONATE 4 % EX LIQD: 60.0000 mL | Freq: Once | CUTANEOUS | Status: DC

## 2017-11-01 MED ORDER — KETOROLAC TROMETHAMINE 30 MG/ML IJ SOLN
30.0000 mg | Freq: Once | INTRAMUSCULAR | Status: DC | PRN
  Administered 2017-11-01: 30 mg via INTRAVENOUS

## 2017-11-01 MED ORDER — LACTATED RINGERS IV SOLN
INTRAVENOUS | Status: DC | PRN
  Administered 2017-11-01: 07:00:00 via INTRAVENOUS

## 2017-11-01 MED ORDER — OXYCODONE HCL 5 MG PO TABS
ORAL_TABLET | ORAL | Status: AC
  Filled 2017-11-01: qty 1

## 2017-11-01 MED ORDER — CHLORHEXIDINE GLUCONATE 4 % EX LIQD
60.0000 mL | Freq: Once | CUTANEOUS | Status: AC
  Administered 2017-11-01: 4 via TOPICAL

## 2017-11-01 MED ORDER — PROMETHAZINE HCL 25 MG/ML IJ SOLN: 6.2500 mg | INTRAMUSCULAR | Status: DC | PRN

## 2017-11-01 MED ORDER — PROPOFOL 10 MG/ML IV BOLUS
INTRAVENOUS | Status: DC | PRN
  Administered 2017-11-01: 150 mg via INTRAVENOUS

## 2017-11-01 MED ORDER — SUGAMMADEX SODIUM 200 MG/2ML IV SOLN
INTRAVENOUS | Status: DC | PRN
  Administered 2017-11-01: 200 mg via INTRAVENOUS

## 2017-11-01 MED ORDER — LIDOCAINE 2% (20 MG/ML) 5 ML SYRINGE
INTRAMUSCULAR | Status: DC | PRN
  Administered 2017-11-01: 100 mg via INTRAVENOUS

## 2017-11-01 MED ORDER — MIDAZOLAM HCL 2 MG/2ML IJ SOLN
INTRAMUSCULAR | Status: AC
  Filled 2017-11-01: qty 2

## 2017-11-01 MED ORDER — HYDROMORPHONE HCL 1 MG/ML IJ SOLN
0.2500 mg | INTRAMUSCULAR | Status: DC | PRN
  Administered 2017-11-01 (×2): 0.5 mg via INTRAVENOUS

## 2017-11-01 MED ORDER — SODIUM CHLORIDE 0.9 % IR SOLN
Status: DC | PRN
  Administered 2017-11-01: 800 mL

## 2017-11-01 MED ORDER — IOPAMIDOL (ISOVUE-300) INJECTION 61%
INTRAVENOUS | Status: AC
  Filled 2017-11-01: qty 50

## 2017-11-01 MED ORDER — SODIUM CHLORIDE 0.9 % IV SOLN
INTRAVENOUS | Status: DC | PRN
  Administered 2017-11-01: 2 g via INTRAVENOUS

## 2017-11-01 MED ORDER — LIDOCAINE 2% (20 MG/ML) 5 ML SYRINGE
INTRAMUSCULAR | Status: AC
  Filled 2017-11-01: qty 10

## 2017-11-01 MED ORDER — PROPOFOL 10 MG/ML IV BOLUS
INTRAVENOUS | Status: AC
  Filled 2017-11-01: qty 20

## 2017-11-01 MED ORDER — KETOROLAC TROMETHAMINE 30 MG/ML IJ SOLN
30.0000 mg | Freq: Three times a day (TID) | INTRAMUSCULAR | Status: DC
  Administered 2017-11-01 – 2017-11-02 (×3): 30 mg via INTRAVENOUS
  Filled 2017-11-01 (×4): qty 1

## 2017-11-01 MED ORDER — 0.9 % SODIUM CHLORIDE (POUR BTL) OPTIME
TOPICAL | Status: DC | PRN
  Administered 2017-11-01: 400 mL

## 2017-11-01 MED ORDER — SODIUM CHLORIDE 0.9 % IV SOLN
INTRAVENOUS | Status: DC | PRN
  Administered 2017-11-01: 10 mL

## 2017-11-01 MED ORDER — DEXAMETHASONE SODIUM PHOSPHATE 10 MG/ML IJ SOLN
INTRAMUSCULAR | Status: DC | PRN
  Administered 2017-11-01: 10 mg via INTRAVENOUS

## 2017-11-01 SURGICAL SUPPLY — 51 items
APPLIER CLIP 5 13 M/L LIGAMAX5 (MISCELLANEOUS) ×2
BANDAGE ADH SHEER 1  50/CT (GAUZE/BANDAGES/DRESSINGS) ×6 IMPLANT
BENZOIN TINCTURE PRP APPL 2/3 (GAUZE/BANDAGES/DRESSINGS) ×2 IMPLANT
BLADE CLIPPER SURG (BLADE) ×1 IMPLANT
CANISTER SUCT 3000ML PPV (MISCELLANEOUS) ×2 IMPLANT
CHLORAPREP W/TINT 26ML (MISCELLANEOUS) ×2 IMPLANT
CLIP APPLIE 5 13 M/L LIGAMAX5 (MISCELLANEOUS) ×1 IMPLANT
COVER MAYO STAND STRL (DRAPES) ×2 IMPLANT
COVER SURGICAL LIGHT HANDLE (MISCELLANEOUS) ×2 IMPLANT
DRAPE C-ARM 42X72 X-RAY (DRAPES) ×2 IMPLANT
DRSG TEGADERM 4X4.75 (GAUZE/BANDAGES/DRESSINGS) ×2 IMPLANT
ELECT REM PT RETURN 9FT ADLT (ELECTROSURGICAL) ×2
ELECTRODE REM PT RTRN 9FT ADLT (ELECTROSURGICAL) ×1 IMPLANT
ENDOLOOP SUT PDS II  0 18 (SUTURE) ×1
ENDOLOOP SUT PDS II 0 18 (SUTURE) IMPLANT
GAUZE SPONGE 2X2 8PLY STRL LF (GAUZE/BANDAGES/DRESSINGS) ×1 IMPLANT
GLOVE BIO SURGEON STRL SZ 6.5 (GLOVE) ×2 IMPLANT
GLOVE BIOGEL M STRL SZ7.5 (GLOVE) ×2 IMPLANT
GLOVE BIOGEL PI IND STRL 6.5 (GLOVE) IMPLANT
GLOVE BIOGEL PI IND STRL 7.0 (GLOVE) IMPLANT
GLOVE BIOGEL PI IND STRL 8 (GLOVE) ×2 IMPLANT
GLOVE BIOGEL PI INDICATOR 6.5 (GLOVE) ×2
GLOVE BIOGEL PI INDICATOR 7.0 (GLOVE) ×1
GLOVE BIOGEL PI INDICATOR 8 (GLOVE) ×1
GOWN STRL REUS W/ TWL LRG LVL3 (GOWN DISPOSABLE) ×3 IMPLANT
GOWN STRL REUS W/TWL 2XL LVL3 (GOWN DISPOSABLE) ×2 IMPLANT
GOWN STRL REUS W/TWL LRG LVL3 (GOWN DISPOSABLE) ×2
GRASPER SUT TROCAR 14GX15 (MISCELLANEOUS) IMPLANT
HEMOSTAT SNOW SURGICEL 2X4 (HEMOSTASIS) ×1 IMPLANT
KIT BASIN OR (CUSTOM PROCEDURE TRAY) ×2 IMPLANT
KIT TURNOVER KIT B (KITS) ×2 IMPLANT
NS IRRIG 1000ML POUR BTL (IV SOLUTION) ×2 IMPLANT
PAD ARMBOARD 7.5X6 YLW CONV (MISCELLANEOUS) ×2 IMPLANT
POUCH RETRIEVAL ECOSAC 10 (ENDOMECHANICALS) ×1 IMPLANT
POUCH RETRIEVAL ECOSAC 10MM (ENDOMECHANICALS) ×1
SCISSORS LAP 5X35 DISP (ENDOMECHANICALS) ×2 IMPLANT
SET CHOLANGIOGRAPH 5 50 .035 (SET/KITS/TRAYS/PACK) ×2 IMPLANT
SET IRRIG TUBING LAPAROSCOPIC (IRRIGATION / IRRIGATOR) ×2 IMPLANT
SLEEVE ENDOPATH XCEL 5M (ENDOMECHANICALS) ×4 IMPLANT
SPECIMEN JAR SMALL (MISCELLANEOUS) ×2 IMPLANT
SPONGE GAUZE 2X2 STER 10/PKG (GAUZE/BANDAGES/DRESSINGS) ×1
STRIP CLOSURE SKIN 1/2X4 (GAUZE/BANDAGES/DRESSINGS) ×2 IMPLANT
SUT MNCRL AB 4-0 PS2 18 (SUTURE) ×2 IMPLANT
SUT VICRYL 0 UR6 27IN ABS (SUTURE) ×1 IMPLANT
TOWEL OR 17X24 6PK STRL BLUE (TOWEL DISPOSABLE) ×1 IMPLANT
TOWEL OR 17X26 10 PK STRL BLUE (TOWEL DISPOSABLE) ×2 IMPLANT
TRAY LAPAROSCOPIC MC (CUSTOM PROCEDURE TRAY) ×2 IMPLANT
TROCAR XCEL BLUNT TIP 100MML (ENDOMECHANICALS) ×2 IMPLANT
TROCAR XCEL NON-BLD 5MMX100MML (ENDOMECHANICALS) ×2 IMPLANT
TUBING INSUFFLATION (TUBING) ×2 IMPLANT
WATER STERILE IRR 1000ML POUR (IV SOLUTION) ×1 IMPLANT

## 2017-11-01 NOTE — Transfer of Care (Signed)
Immediate Anesthesia Transfer of Care Note  Patient: Steven Miles  Procedure(s) Performed: LAPAROSCOPIC CHOLECYSTECTOMY WITH INTRAOPERATIVE CHOLANGIOGRAM ERAS PATHWAY (N/A Abdomen)  Patient Location: PACU  Anesthesia Type:General  Level of Consciousness: awake, alert  and oriented  Airway & Oxygen Therapy: Patient Spontanous Breathing and Patient connected to nasal cannula oxygen  Post-op Assessment: Report given to RN, Post -op Vital signs reviewed and stable and Patient moving all extremities X 4  Post vital signs: Reviewed and stable  Last Vitals:  Vitals Value Taken Time  BP 139/80 11/01/2017  9:07 AM  Temp 36.5 C 11/01/2017  9:07 AM  Pulse 80 11/01/2017  9:07 AM  Resp 17 11/01/2017  9:07 AM  SpO2 100 % 11/01/2017  9:07 AM    Last Pain:  Vitals:   11/01/17 0907  TempSrc:   PainSc: Asleep         Complications: No apparent anesthesia complications

## 2017-11-01 NOTE — Anesthesia Procedure Notes (Signed)
Procedure Name: Intubation Date/Time: 11/01/2017 7:42 AM Performed by: Mariea Clonts, CRNA Pre-anesthesia Checklist: Patient identified, Emergency Drugs available, Suction available and Patient being monitored Patient Re-evaluated:Patient Re-evaluated prior to induction Oxygen Delivery Method: Circle System Utilized Preoxygenation: Pre-oxygenation with 100% oxygen Induction Type: IV induction Ventilation: Mask ventilation without difficulty and Oral airway inserted - appropriate to patient size Laryngoscope Size: Mac and 3 Grade View: Grade II Tube type: Oral Tube size: 7.5 mm Number of attempts: 1 Airway Equipment and Method: Stylet and Oral airway Placement Confirmation: ETT inserted through vocal cords under direct vision,  positive ETCO2 and breath sounds checked- equal and bilateral Tube secured with: Tape Dental Injury: Teeth and Oropharynx as per pre-operative assessment

## 2017-11-01 NOTE — Discharge Instructions (Signed)
[facility] °LAPAROSCOPIC SURGERY: POST OP INSTRUCTIONS °Always review your discharge instruction sheet given to you by the facility where your surgery was performed. °IF YOU HAVE DISABILITY OR FAMILY LEAVE FORMS, YOU MUST BRING THEM TO THE OFFICE FOR PROCESSING.   °DO NOT GIVE THEM TO YOUR DOCTOR. ° °PAIN CONTROL ° °1. First take acetaminophen (Tylenol) AND/or ibuprofen (Advil) to control your pain after surgery.  Follow directions on package.  Taking acetaminophen (Tylenol) and/or ibuprofen (Advil) regularly after surgery will help to control your pain and lower the amount of prescription pain medication you may need.  You should not take more than 4,000 mg (4 grams) of acetaminophen (Tylenol) in 24 hours.  You should not take ibuprofen (Advil), aleve, motrin, naprosyn or other NSAIDS if you have a history of stomach ulcers or chronic kidney disease.  °2. A prescription for pain medication may be given to you upon discharge.  Take your pain medication as prescribed, if you still have uncontrolled pain after taking acetaminophen (Tylenol) or ibuprofen (Advil). °3. Use ice packs to help control pain. °4. If you need a refill on your pain medication, please contact your pharmacy.  They will contact our office to request authorization. Prescriptions will not be filled after 5pm or on week-ends. ° °HOME MEDICATIONS °5. Take your usually prescribed medications unless otherwise directed. ° °DIET °6. You should follow a light diet the first few days after arrival home.  Be sure to include lots of fluids daily. Avoid fatty, fried foods.  ° °CONSTIPATION °7. It is common to experience some constipation after surgery and if you are taking pain medication.  Increasing fluid intake and taking a stool softener (such as Colace) will usually help or prevent this problem from occurring.  A mild laxative (Milk of Magnesia or Miralax) should be taken according to package instructions if there are no bowel  movements after 48 hours. ° °WOUND/INCISION CARE °8. Most patients will experience some swelling and bruising in the area of the incisions.  Ice packs will help.  Swelling and bruising can take several days to resolve.  °9. Unless discharge instructions indicate otherwise, follow guidelines below  °a. STERI-STRIPS - you may remove your outer bandages 48 hours after surgery, and you may shower at that time.  You have steri-strips (small skin tapes) in place directly over the incision.  These strips should be left on the skin for 7-10 days.   °b. DERMABOND/SKIN GLUE - you may shower in 24 hours.  The glue will flake off over the next 2-3 weeks. °10. Any sutures or staples will be removed at the office during your follow-up visit. ° °ACTIVITIES °11. You may resume regular (light) daily activities beginning the next day--such as daily self-care, walking, climbing stairs--gradually increasing activities as tolerated.  You may have sexual intercourse when it is comfortable.  Refrain from any heavy lifting or straining until approved by your doctor. °a. You may drive when you are no longer taking prescription pain medication, you can comfortably wear a seatbelt, and you can safely maneuver your car and apply brakes. ° °FOLLOW-UP °12. You should see your doctor in the office for a follow-up appointment approximately 2-3 weeks after your surgery.  You should have been given your post-op/follow-up appointment when your surgery was scheduled.  If you did not receive a post-op/follow-up appointment, make sure that you call for this appointment within a day or two after you arrive home to insure a convenient appointment time. ° °OTHER   INSTRUCTIONS °13.  ° °WHEN TO CALL YOUR DOCTOR: °1. Fever over 101.0 °2. Inability to urinate °3. Continued bleeding from incision. °4. Increased pain, redness, or drainage from the incision. °5. Increasing abdominal pain ° °The clinic staff is available to answer your questions during regular  business hours.  Please don’t hesitate to call and ask to speak to one of the nurses for clinical concerns.  If you have a medical emergency, go to the nearest emergency room or call 911.  A surgeon from [facility] is always on call at the hospital. °[address], [address], [place], [place]  [address] ? [address] [address], , [place]   [address] °[phone] ? [phone] ? FAX [phone] °Web site: [email or web address] ° °

## 2017-11-01 NOTE — Op Note (Signed)
Demetreus Lothamer [id number] 20-Aug-1970 11/01/2017  Laparoscopic Cholecystectomy with IOC Procedure Note  Indications: This patient presents with symptomatic gallbladder disease and will undergo laparoscopic cholecystectomy.  I had met the patient in the clinic last week for symptomatic cholelithiasis and he was booked for surgery for today; however, he was admitted to the hospital a few days ago with gallstone pancreatitis.  The admitting team felt that he was ready for surgery today and I concurred.  His LFTs were trending down.  He no longer had epigastric pain.  Pre-operative Diagnosis: Gallstone pancreatitis  Post-operative Diagnosis: Acute on chronic calculus cholecystitis, gallstone pancreatitis  Surgeon: Greer Pickerel MD  Assistants: none  Anesthesia: General endotracheal anesthesia  Procedure Details  The patient was seen again in the Holding Room. The risks, benefits, complications, treatment options, and expected outcomes were discussed with the patient. The possibilities of reaction to medication, pulmonary aspiration, perforation of viscus, bleeding, recurrent infection, finding a normal gallbladder, the need for additional procedures, failure to diagnose a condition, the possible need to convert to an open procedure, and creating a complication requiring transfusion or operation were discussed with the patient. The likelihood of improving the patient's symptoms with return to their baseline status is good.  The patient and/or family concurred with the proposed plan, giving informed consent. The site of surgery properly noted. The patient was taken to Operating Room, identified as Ritchie Klee and the procedure verified as Laparoscopic Cholecystectomy with Intraoperative Cholangiogram. A Time Out was held and the above information confirmed. Antibiotic prophylaxis was administered.   Prior to the induction of general anesthesia, antibiotic prophylaxis was administered. General endotracheal  anesthesia was then administered and tolerated well. After the induction, the abdomen was prepped with Chloraprep and draped in the sterile fashion. The patient was positioned in the supine position.  Local anesthetic agent was injected into the skin near the umbilicus and an incision made. We dissected down to the abdominal fascia with blunt dissection.  The fascia was incised vertically and we entered the peritoneal cavity bluntly.  A pursestring suture of 0-Vicryl was placed around the fascial opening.  The Hasson cannula was inserted and secured with the stay suture.  Pneumoperitoneum was then created with CO2 and tolerated well without any adverse changes in the patient's vital signs. An 5-mm port was placed in the subxiphoid position.  Two 5-mm ports were placed in the right upper quadrant. All skin incisions were infiltrated with a local anesthetic agent before making the incision and placing the trocars.   We positioned the patient in reverse Trendelenburg, tilted slightly to the patient's left.  The gallbladder was identified.  He had some omental attachments to the fundus and body of the gallbladder which were taken down with hook electrocautery. the fundus was grasped and retracted cephalad. Adhesions were lysed bluntly and with the electrocautery where indicated, taking care not to injure any adjacent organs or viscus. The infundibulum was grasped and retracted laterally, exposing the peritoneum overlying the triangle of Calot. This was then divided and exposed in a blunt fashion.  There was some edema in this area.  A critical view of the cystic duct and cystic artery was obtained.  The cystic duct was clearly identified and bluntly dissected circumferentially. The cystic duct was ligated with a clip distally.   An incision was made in the cystic duct and the [facility] cholangiogram catheter introduced. The catheter was secured using a clip. A cholangiogram was then obtained which showed good  visualization of the distal  and proximal biliary tree with no sign of filling defects or obstruction.  Contrast flowed easily into the duodenum. The catheter was then removed.   The cystic duct was then ligated with clips and divided.  I did end up putting in a PDS Endoloop around the cystic duct stump. the cystic artery which had been identified & dissected free was ligated with clips and divided as well.   The gallbladder was dissected from the liver bed in retrograde fashion with the electrocautery. The gallbladder was removed and placed in an Ecco sac.  The gallbladder and Ecco sac were then removed through the umbilical port site. The liver bed was irrigated and inspected. Hemostasis was achieved with the electrocautery. Copious irrigation was utilized and was repeatedly aspirated until clear.  I did place a piece of surgical snow in the gallbladder fossa.  I infiltrated local in bilateral lateral abdominal walls as a tap block.  The pursestring suture was used to close the umbilical fascia.  I placed an additional interrupted 0 Vicryl at the umbilical fascia using the PMI suture passer.  We again inspected the right upper quadrant for hemostasis.  The umbilical closure was inspected and there was no air leak and nothing trapped within the closure. Pneumoperitoneum was released as we removed the trocars.  4-0 Monocryl was used to close the skin.   Benzoin, steri-strips, and clean dressings were applied. The patient was then extubated and brought to the recovery room in stable condition. Instrument, sponge, and needle counts were correct at closure and at the conclusion of the case.   Findings: Positive critical view; normal IOC; cholecystitis w/ Cholelithiasis  Estimated Blood Loss: Minimal         Drains: None         Specimens: Gallbladder           Complications: None; patient tolerated the procedure well.         Disposition: PACU - hemodynamically stable.         Condition:  stable  Leighton Ruff. Redmond Pulling, MD, FACS General, Bariatric, & Minimally Invasive Surgery [facility], [place]

## 2017-11-01 NOTE — Anesthesia Postprocedure Evaluation (Signed)
Anesthesia Post Note  Patient: Steven Miles  Procedure(s) Performed: LAPAROSCOPIC CHOLECYSTECTOMY WITH INTRAOPERATIVE CHOLANGIOGRAM ERAS PATHWAY (N/A Abdomen)     Patient location during evaluation: PACU Anesthesia Type: General Level of consciousness: awake Pain management: pain level controlled Vital Signs Assessment: post-procedure vital signs reviewed and stable Respiratory status: spontaneous breathing Cardiovascular status: stable Postop Assessment: no apparent nausea or vomiting Anesthetic complications: no    Last Vitals:  Vitals:   11/01/17 1002 11/01/17 1450  BP: 133/82 115/73  Pulse: 62 (!) 52  Resp: 18 16  Temp: (!) 36.4 C 36.7 C  SpO2: 98% 97%    Last Pain:  Vitals:   11/01/17 1450  TempSrc: Oral  PainSc:    Pain Goal: Patients Stated Pain Goal: 3 (11/01/17 1100)               Steven Miles,Steven Miles

## 2017-11-01 NOTE — Interval H&P Note (Signed)
History and Physical Interval Note:  11/01/2017 7:22 AM  Steven Miles  has presented today for surgery, with the diagnosis of symptomatic cholelithiasis  The various methods of treatment have been discussed with the patient and family. After consideration of risks, benefits and other options for treatment, the patient has consented to  Procedure(s): LAPAROSCOPIC CHOLECYSTECTOMY WITH INTRAOPERATIVE CHOLANGIOGRAM ERAS PATHWAY (N/A) as a surgical intervention .  The patient's history has been reviewed, patient examined, no change in status, stable for surgery.  I have reviewed the patient's chart and labs.  Questions were answered to the patient's satisfaction.      Mary SellaEric M. Andrey CampanileWilson, MD, FACS General, Bariatric, & Minimally Invasive Surgery [facility] [place]  Gaynelle AduEric Karlon Miles

## 2017-11-02 ENCOUNTER — Encounter (HOSPITAL_COMMUNITY): Payer: Self-pay | Admitting: General Surgery

## 2017-11-02 LAB — COMPREHENSIVE METABOLIC PANEL
ALT: 315 U/L — ABNORMAL HIGH (ref 0–44)
AST: 103 U/L — ABNORMAL HIGH (ref 15–41)
Albumin: 2.9 g/dL — ABNORMAL LOW (ref 3.5–5.0)
Alkaline Phosphatase: 241 U/L — ABNORMAL HIGH (ref 38–126)
Anion gap: 11 (ref 5–15)
BUN: 9 mg/dL (ref 6–20)
CHLORIDE: 109 mmol/L (ref 98–111)
CO2: 20 mmol/L — AB (ref 22–32)
CREATININE: 1.1 mg/dL (ref 0.61–1.24)
Calcium: 8.7 mg/dL — ABNORMAL LOW (ref 8.9–10.3)
GFR calc non Af Amer: >60 mL/min (ref >60)
Glucose, Bld: 100 mg/dL — ABNORMAL HIGH (ref 70–99)
POTASSIUM: 4.2 mmol/L (ref 3.5–5.1)
SODIUM: 140 mmol/L (ref 135–145)
Total Bilirubin: 1.3 mg/dL — ABNORMAL HIGH (ref 0.3–1.2)
Total Protein: 5.9 g/dL — ABNORMAL LOW (ref 6.5–8.1)

## 2017-11-02 LAB — LIPASE, BLOOD: LIPASE: 67 U/L — AB (ref 11–51)

## 2017-11-02 MED ORDER — OXYCODONE HCL 5 MG PO TABS: 5.0000 mg | ORAL_TABLET | Freq: Four times a day (QID) | ORAL | 0 refills | Status: DC | PRN

## 2017-11-02 MED ORDER — ACETAMINOPHEN 325 MG PO TABS: 650.0000 mg | ORAL_TABLET | Freq: Four times a day (QID) | ORAL | Status: AC | PRN

## 2017-11-02 NOTE — Progress Notes (Signed)
Pt discharged home in stable condition after going over discharge teaching with no concerns voiced 

## 2017-11-02 NOTE — Discharge Summary (Addendum)
[facility] Discharge Summary   Patient ID: Steven BrunsKevin Slayton MRN: [id number] DOB/AGE: Nov 19, 1970 47 y.o.  Admit date: 10/29/2017 Discharge date: 11/02/2017  Discharge Diagnosis Patient Active Problem List   Diagnosis Date Noted   Gallstone pancreatitis    . Cholecystitis 10/29/2017    Imaging: Dg Cholangiogram Operative  Result Date: 11/01/2017 CLINICAL DATA:  Cholecystectomy for symptomatic cholelithiasis. EXAM: INTRAOPERATIVE CHOLANGIOGRAM TECHNIQUE: Cholangiographic images from the C-arm fluoroscopic device were submitted for interpretation post-operatively. Please see the procedural report for the amount of contrast and the fluoroscopy time utilized. COMPARISON:  Right upper quadrant ultrasound on 10/20/2017 FINDINGS: Intraoperative imaging with a C-arm demonstrates normal opacified bile ducts without evidence of obstruction or filling defect. Contrast enters the duodenum. No contrast extravasation identified. IMPRESSION: Normal intraoperative cholangiogram. Electronically Signed   By: Irish LackGlenn  Yamagata M.D.   On: 11/01/2017 08:42    Procedures Dr. Gaynelle AduEric Wilson (11/01/17) - [facility]   Hospital Course:  47 y/o M [facility] with a cc worsening RUQ/epigastric pain associated with oral intake. He has known gallstones and was referred to our [facility] office, seen by Dr. Andrey CampanileWilson, and scheduled for lap chole on 11/01/17. His ED workup was significant for gallstone pancreatitis with a lipase >2,000 and he was admitted for further management. He was started on IV antibiotics and bowel rest. On 11/01/17 his pancreatitis was improved, total bilirubin trending down but not normalized, abdomen non-tender, and underwent above procedure by Dr. Andrey CampanileWilson. Intraoperative cholangiogram was negative for CBD stone. On POD#1 patients vitals were stable LFTs and lipase continuing to trend down, pain controlled, tolerating oral intake, and medically stable for discharge home.  He will follow up with Dr. Andrey CampanileWilson as below and knows to call with questions concerns.  Physical Exam: General:  Alert, NAD, pleasant, comfortable Abd:  Soft, ND, appropriately tender, incisions C/D/I dressed with steri strips and bandages.   Allergies as of 11/02/2017   No Known Allergies     Medication List    STOP taking these medications   ondansetron 4 MG disintegrating tablet Commonly known as:  ZOFRAN-ODT   traMADol 50 MG tablet Commonly known as:  ULTRAM     TAKE these medications   acetaminophen 325 MG tablet Commonly known as:  TYLENOL Take 2-3 tablets (650-975 mg total) by mouth every 6 (six) hours as needed for mild pain (or temp > 100).   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   HYDROcodone-acetaminophen 5-325 MG tablet Commonly known as:  NORCO/VICODIN Take 1-2 tablets by mouth every 6 (six) hours as needed. What changed:  reasons to take this   pantoprazole 40 MG tablet Commonly known as:  PROTONIX Take 1 tablet (40 mg total) by mouth daily. What changed:    when to take this  reasons to take this        Follow-up Information    Gaynelle AduWilson, Eric, MD Follow up.   Specialty:  General Surgery Why:  call to confirm post-operative follow up appointment date/time. Contact information: [address] [place] [id number] [phone]           Signed: Hosie SpangleElizabeth Ree Alcalde, [facility] 11/02/2017, 10:40 AM Pager: [phone] Consults: [phone] Mon-Fri 7:00 am-4:30 pm Sat-Sun 7:00 am-11:30 am

## 2018-06-10 ENCOUNTER — Ambulatory Visit: Payer: BC Managed Care – PPO | Admitting: Podiatry

## 2018-06-10 ENCOUNTER — Other Ambulatory Visit: Payer: Self-pay

## 2018-06-10 ENCOUNTER — Ambulatory Visit (INDEPENDENT_AMBULATORY_CARE_PROVIDER_SITE_OTHER): Payer: BC Managed Care – PPO

## 2018-06-10 ENCOUNTER — Encounter: Payer: Self-pay | Admitting: Podiatry

## 2018-06-10 ENCOUNTER — Other Ambulatory Visit: Payer: Self-pay | Admitting: Podiatry

## 2018-06-10 VITALS — BP 79/48 | HR 57

## 2018-06-10 DIAGNOSIS — M7662 Achilles tendinitis, left leg: Secondary | ICD-10-CM

## 2018-06-10 DIAGNOSIS — M9261 Juvenile osteochondrosis of tarsus, right ankle: Secondary | ICD-10-CM | POA: Diagnosis not present

## 2018-06-10 DIAGNOSIS — M79672 Pain in left foot: Secondary | ICD-10-CM

## 2018-06-10 DIAGNOSIS — M9262 Juvenile osteochondrosis of tarsus, left ankle: Secondary | ICD-10-CM

## 2018-06-12 NOTE — Progress Notes (Signed)
   HPI: 48 year old male presenting today as a new patient, referred by Dr. Elijah Birk, with a chief complaint of posterior left heel pain that began about one year ago. He states he was told it may be Haglund's deformity. He was given a CAM boot which he wore for about 5 weeks but did not provide any relief. Being ambulatory for long periods of time increases the pain. Patient is here for further evaluation and treatment.  Past Medical History:  Diagnosis Date  . Gallstones   . Medical history non-contributory       Physical Exam: General: The patient is alert and oriented x3 in no acute distress.  Dermatology: Skin is warm, dry and supple bilateral lower extremities. Negative for open lesions or macerations.  Vascular: Palpable pedal pulses bilaterally. No edema or erythema noted. Capillary refill within normal limits.  Neurological: Epicritic and protective threshold grossly intact bilaterally.   Musculoskeletal Exam: Pain on palpation noted to the posterior tubercle of the left calcaneus at the insertion of the Achilles tendon consistent with retrocalcaneal bursitis. 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 Range of motion within normal limits. Muscle strength 5/5 in all muscle groups bilateral lower extremities.  Radiographic Exam:  Posterior calcaneal spur noted to the respective calcaneus on lateral view. No fracture or dislocation noted. Normal osseous mineralization noted.     Assessment: 1. Insertional Achilles tendinitis left 2. 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 Patient was evaluated. Radiographs were reviewed today. 2. Discussed surgery vs EPAT vs physical therapy.  3. Patient wants to pursue EPAT.  4. Appointment with Shanda Bumps, RN for EPAT.  5. Resume stretching exercises daily.  6. Return to clinic in 3 months.   Teacher, music.    Felecia Shelling, DPM [facility]  Dr. Felecia Shelling, DPM    [address]                                        [place], [place] [address]                Office [phone]  Fax [phone]

## 2018-06-18 ENCOUNTER — Telehealth: Payer: Self-pay | Admitting: Podiatry

## 2018-06-18 NOTE — Telephone Encounter (Signed)
Patient is having severe pain. Office had to cx EPAT due to JQ not being in office for the next 3 weeks. He needs to know what to do next. Please call

## 2018-06-18 NOTE — Telephone Encounter (Signed)
I informed he should go back in to the cam boot, decrease activity, rest, ice and if tolerate OTC ibuprofen take as the package instructs. Pt states he doesn't like to take antiinflammatory medications it bothers his stomach. I offered a topical and he states he doesn't like to use the one he already has. I told pt we were beginning the EPAT schedule again in May, pt states he would like to be the 1st on the schedule. Transferred to schedulers.

## 2018-06-21 ENCOUNTER — Other Ambulatory Visit: Payer: BC Managed Care – PPO

## 2018-06-28 ENCOUNTER — Other Ambulatory Visit: Payer: Self-pay

## 2018-06-28 ENCOUNTER — Ambulatory Visit: Payer: BC Managed Care – PPO

## 2018-06-28 DIAGNOSIS — M7662 Achilles tendinitis, left leg: Secondary | ICD-10-CM

## 2018-06-28 DIAGNOSIS — M79676 Pain in unspecified toe(s): Secondary | ICD-10-CM

## 2018-06-28 DIAGNOSIS — M9262 Juvenile osteochondrosis of tarsus, left ankle: Secondary | ICD-10-CM

## 2018-06-28 DIAGNOSIS — M9261 Juvenile osteochondrosis of tarsus, right ankle: Secondary | ICD-10-CM

## 2018-07-04 ENCOUNTER — Other Ambulatory Visit: Payer: Self-pay

## 2018-07-04 ENCOUNTER — Ambulatory Visit (INDEPENDENT_AMBULATORY_CARE_PROVIDER_SITE_OTHER): Payer: BC Managed Care – PPO | Admitting: Podiatry

## 2018-07-04 DIAGNOSIS — B351 Tinea unguium: Secondary | ICD-10-CM

## 2018-07-04 DIAGNOSIS — M7662 Achilles tendinitis, left leg: Secondary | ICD-10-CM

## 2018-07-04 DIAGNOSIS — M9262 Juvenile osteochondrosis of tarsus, left ankle: Secondary | ICD-10-CM

## 2018-07-04 DIAGNOSIS — M9261 Juvenile osteochondrosis of tarsus, right ankle: Secondary | ICD-10-CM

## 2018-07-11 ENCOUNTER — Ambulatory Visit: Payer: BC Managed Care – PPO

## 2018-07-12 ENCOUNTER — Ambulatory Visit: Payer: Self-pay | Admitting: Podiatry

## 2018-07-12 ENCOUNTER — Other Ambulatory Visit: Payer: Self-pay

## 2018-07-12 DIAGNOSIS — M7662 Achilles tendinitis, left leg: Secondary | ICD-10-CM

## 2018-07-12 DIAGNOSIS — M79676 Pain in unspecified toe(s): Secondary | ICD-10-CM

## 2018-07-12 DIAGNOSIS — M9262 Juvenile osteochondrosis of tarsus, left ankle: Secondary | ICD-10-CM

## 2018-07-12 DIAGNOSIS — M9261 Juvenile osteochondrosis of tarsus, right ankle: Secondary | ICD-10-CM

## 2018-07-12 NOTE — Progress Notes (Signed)
Patient is here today for his third treatment.  Treatment includes shockwave therapy to the level left posterior heel.  Patient only recently diagnosed with Achilles tendinitis.  He states that he has noticed at least a 40% improvement in his pain and is currently resumed regular activities such as biking and hiking.  ESWT administered to posterior heel at 6 J.  Calf was also treated.  He is to avoid NSAIDs and ice, I did advised him to take it easy on resuming regular activities until treatment is completed.  He is to follow-up in 2 weeks for his fourth treatment.

## 2018-07-26 ENCOUNTER — Ambulatory Visit: Payer: BC Managed Care – PPO

## 2018-07-26 ENCOUNTER — Other Ambulatory Visit: Payer: Self-pay

## 2018-07-26 DIAGNOSIS — M7662 Achilles tendinitis, left leg: Secondary | ICD-10-CM

## 2018-07-26 DIAGNOSIS — M9262 Juvenile osteochondrosis of tarsus, left ankle: Secondary | ICD-10-CM

## 2018-07-26 DIAGNOSIS — M79676 Pain in unspecified toe(s): Secondary | ICD-10-CM

## 2018-07-26 DIAGNOSIS — M9261 Juvenile osteochondrosis of tarsus, right ankle: Secondary | ICD-10-CM

## 2018-07-26 NOTE — Progress Notes (Signed)
Patient is here today for his third treatment.  Treatment includes shockwave therapy to the level left posterior heel.  Patient only recently diagnosed with Achilles tendinitis.  He states that the pain comes and goes, it specifically gets worse on the second week. He is currently going on bike rides and remaining active.    ESWT administered to posterior heel at 4 J.  Calf was also treated.  He is to avoid NSAIDs and ice, I did advised him to take it easy on resuming regular activities until treatment is completed.  He is to follow-up in 1  week for his 5th treatment.

## 2018-07-29 ENCOUNTER — Other Ambulatory Visit: Payer: BC Managed Care – PPO

## 2018-07-31 NOTE — Progress Notes (Signed)
Patient is here today for his third treatment.  Treatment includes shockwave therapy to the level left posterior heel.  Patient only recently diagnosed with Achilles tendinitis.  He states that the pain comes and goes, it specifically gets worse on the second week. He is currently going on bike rides and remaining active.    ESWT administered to posterior heel at 4 J.  Calf was also treated.  He is to avoid NSAIDs and ice, I did advised him to take it easy on resuming regular activities until treatment is completed.  He is to follow-up in 1  week for his 3rd treatment.

## 2018-07-31 NOTE — Progress Notes (Signed)
Patient is here today for his third treatment.  Treatment includes shockwave therapy to the level left posterior heel.  Patient only recently diagnosed with Achilles tendinitis.  He states that the pain comes and goes, it specifically gets worse on the second week. He is currently going on bike rides and remaining active.    ESWT administered to posterior heel at 4 J.  Calf was also treated.  He is to avoid NSAIDs and ice, I did advised him to take it easy on resuming regular activities until treatment is completed.  He is to follow-up in 1  week for his 2nd treatment.

## 2018-08-01 ENCOUNTER — Other Ambulatory Visit: Payer: Self-pay

## 2018-08-01 ENCOUNTER — Ambulatory Visit: Payer: BC Managed Care – PPO

## 2018-08-01 DIAGNOSIS — M9262 Juvenile osteochondrosis of tarsus, left ankle: Principal | ICD-10-CM

## 2018-08-01 DIAGNOSIS — M9261 Juvenile osteochondrosis of tarsus, right ankle: Secondary | ICD-10-CM

## 2018-08-01 DIAGNOSIS — M7662 Achilles tendinitis, left leg: Secondary | ICD-10-CM

## 2018-08-01 NOTE — Progress Notes (Signed)
Patient is here today for his third treatment.  Treatment includes shockwave therapy to the level left posterior heel.  Patient only recently diagnosed with Achilles tendinitis.  He states that the pain was worse this week, but he did admit to wearing fishing boots and hiking in them for an entire day. He still remains very active and has not changed his activity levels.    ESWT administered to posterior heel at 4.6 J.  Calf was also treated.  He is to avoid NSAIDs and ice, I did advised him to take it easy on resuming regular activities until treatment is completed.  He is to follow-up in 1  week for his 6th treatment.

## 2018-08-07 ENCOUNTER — Other Ambulatory Visit: Payer: Self-pay

## 2018-08-07 ENCOUNTER — Ambulatory Visit (INDEPENDENT_AMBULATORY_CARE_PROVIDER_SITE_OTHER): Payer: BC Managed Care – PPO | Admitting: Podiatry

## 2018-08-07 DIAGNOSIS — M7662 Achilles tendinitis, left leg: Secondary | ICD-10-CM

## 2018-08-07 DIAGNOSIS — M9261 Juvenile osteochondrosis of tarsus, right ankle: Secondary | ICD-10-CM

## 2018-08-07 DIAGNOSIS — M9262 Juvenile osteochondrosis of tarsus, left ankle: Secondary | ICD-10-CM

## 2018-08-11 NOTE — Progress Notes (Signed)
   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 He states he feels the EPAT treatments he has been receiving have helped him significantly and is hesitant to stop them now. He reports daily stretching as well. There are no worsening factors noted at this time.  He also reports a new complaint of intermittent soreness of the right ankle that began 1-2 weeks ago. He reports some associated swelling. There are no modifying factors noted and he has not done anything for treatment. Patient is here for further evaluation and treatment.  Past Medical History:  Diagnosis Date  . Gallstones   . Medical history non-contributory       Physical Exam: General: The patient is alert and oriented x3 in no acute distress.  Dermatology: Skin is warm, dry and supple bilateral lower extremities. Negative for open lesions or macerations.  Vascular: Palpable pedal pulses bilaterally. No edema or erythema noted. Capillary refill within normal limits.  Neurological: Epicritic and protective threshold grossly intact bilaterally.   Musculoskeletal Exam: Pain on palpation noted to the posterior tubercle of the left calcaneus at the insertion of the Achilles tendon consistent with retrocalcaneal bursitis. 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 Range of motion within normal limits. Muscle strength 5/5 in all muscle groups bilateral lower extremities.  Assessment: 1. Insertional Achilles tendinitis left - improved  2. 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 Patient was evaluated.  2. Continue two additional sessions of EPAT with Shanda Bumps, Charity fundraiser.  3. Recommended daily stretching.  4. Return to clinic as needed.   Teacher, music.    Felecia Shelling, DPM [facility]  Dr. Felecia Shelling, DPM    [address]                                         [place], [place] [address]                Office [phone]  Fax [phone]

## 2018-08-14 ENCOUNTER — Other Ambulatory Visit: Payer: Self-pay

## 2018-08-14 ENCOUNTER — Ambulatory Visit (INDEPENDENT_AMBULATORY_CARE_PROVIDER_SITE_OTHER): Payer: BC Managed Care – PPO | Admitting: Podiatry

## 2018-08-14 DIAGNOSIS — M9261 Juvenile osteochondrosis of tarsus, right ankle: Secondary | ICD-10-CM

## 2018-08-14 DIAGNOSIS — M7662 Achilles tendinitis, left leg: Secondary | ICD-10-CM

## 2018-08-14 DIAGNOSIS — M9262 Juvenile osteochondrosis of tarsus, left ankle: Secondary | ICD-10-CM

## 2018-08-14 NOTE — Progress Notes (Signed)
Patient is here today for his third treatment.  Treatment includes shockwave therapy to the level left posterior heel.  Patient only recently diagnosed with Achilles tendinitis.  He states that the pain was worse this week, but he did foot paddle his kayak for 80 miles over the weekend   ESWT administered to posterior bilateral heel at 5 J.  Calf was also treated.  He is to avoid NSAIDs and ice, I did advised him to take it easy on resuming regular activities until treatment is completed.  He is to follow-up in 1  week for his 7th treatment.

## 2018-08-22 ENCOUNTER — Ambulatory Visit: Payer: BC Managed Care – PPO

## 2018-08-22 ENCOUNTER — Other Ambulatory Visit: Payer: Self-pay

## 2018-08-22 DIAGNOSIS — M9262 Juvenile osteochondrosis of tarsus, left ankle: Secondary | ICD-10-CM

## 2018-08-22 DIAGNOSIS — M7662 Achilles tendinitis, left leg: Secondary | ICD-10-CM

## 2018-08-22 DIAGNOSIS — M9261 Juvenile osteochondrosis of tarsus, right ankle: Secondary | ICD-10-CM

## 2018-08-22 NOTE — Progress Notes (Signed)
Patient is here today for his third treatment.  Treatment includes shockwave therapy to the level left posterior heel.  Patient only recently diagnosed with Achilles tendinitis.  He states that the pain has worsened in his right foot.  He also states that he has been very active doing well a lot of walking, boating, paddling and various other outdoor activities.  ESWT administered to posterior bilateral heel at 5 J.   He is to avoid NSAIDs and ice, I did advised him to take it easy on resuming regular activities until treatment is completed.  He is to follow-up in approximately 4 weeks for reevaluation with Dr. Logan Bores.

## 2018-08-27 IMAGING — CT CT ABD-PELV W/ CM
2 of 5 series · 16 of 46 positions shown, 18 images · IV contrast (ISOVUE)
Comparison: Ultrasound 09/24/2017

CLINICAL DATA: Mid abdominal pain history of stomach ulcers

EXAM:
CT ABDOMEN AND PELVIS WITH CONTRAST
TECHNIQUE: Multidetector CT imaging of the abdomen and pelvis was performed
using the standard protocol following bolus administration of
intravenous contrast.
CONTRAST:  100mL HT944W-2MM IOPAMIDOL (HT944W-2MM) INJECTION 61%

[Series 2: axial st · axial · 0.73mm/px · z∈[-505,-75]mm · 13 of 100 slices shown, 15 images]
[im 7/100  soft-tissue]
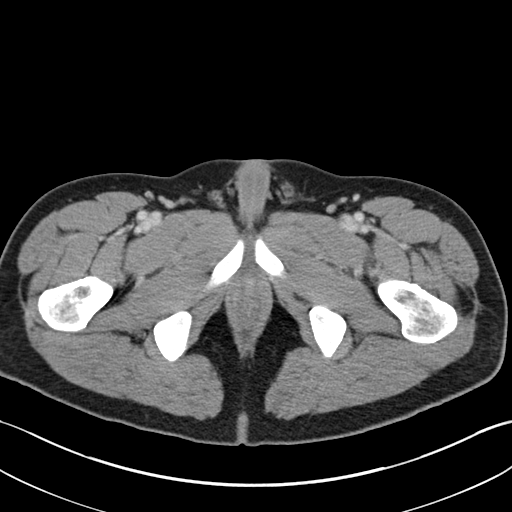
[im 7/100  bone]
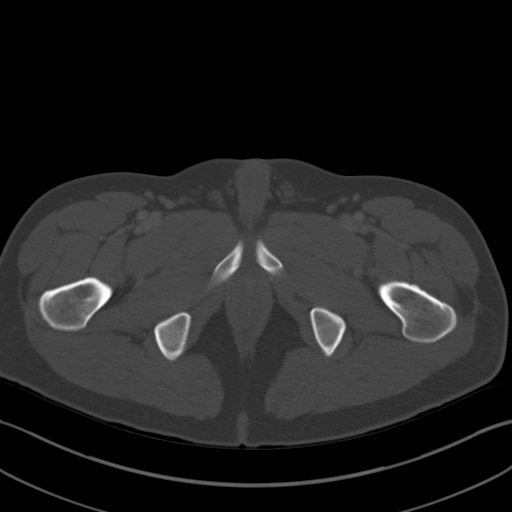
[im 13/100  soft-tissue]
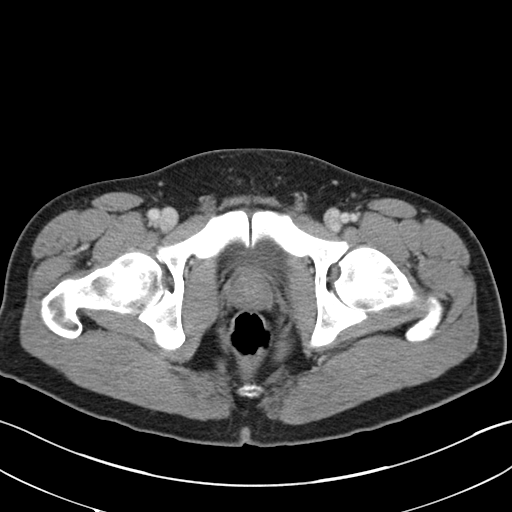
[im 19/100  soft-tissue]
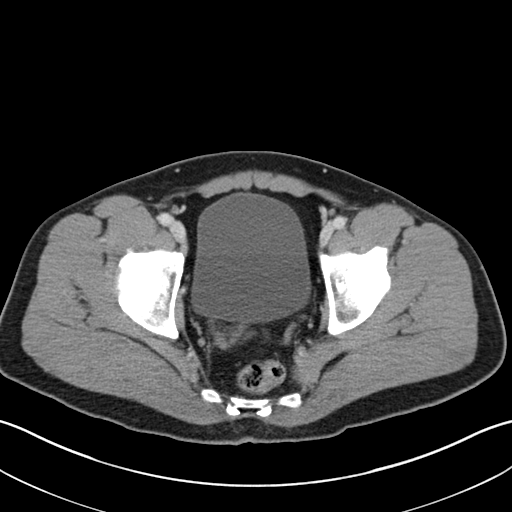
[im 31/100  soft-tissue]
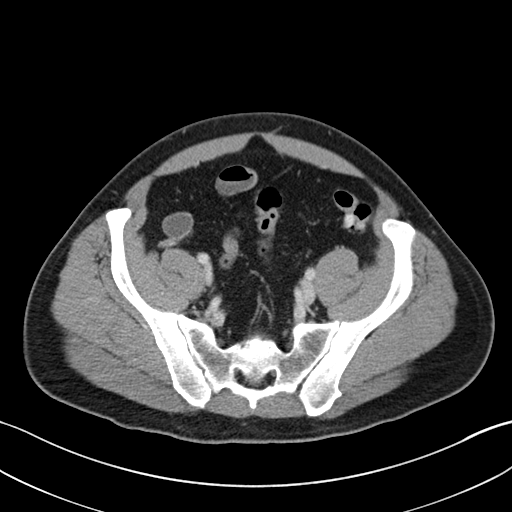
[im 38/100  soft-tissue]
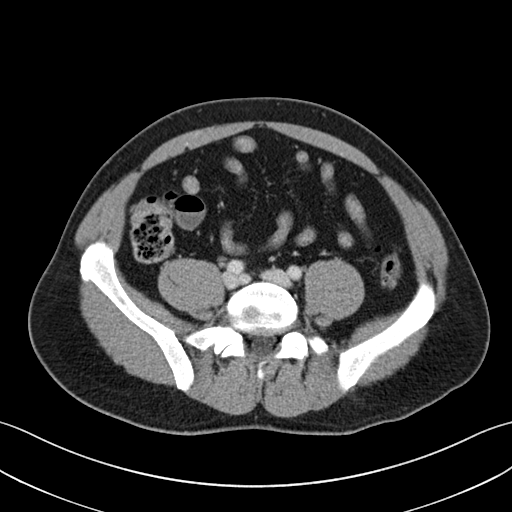
[im 44/100  soft-tissue]
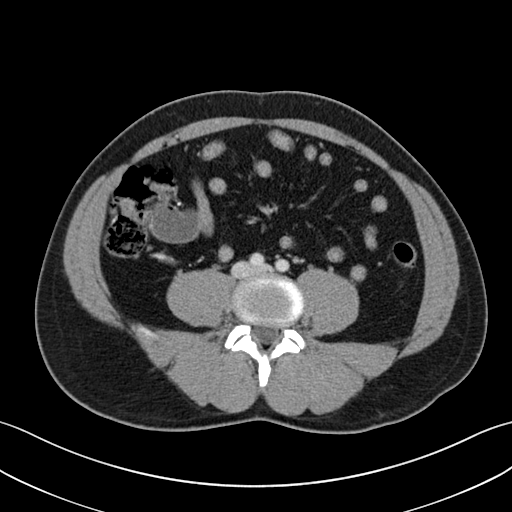
[im 50/100  soft-tissue]
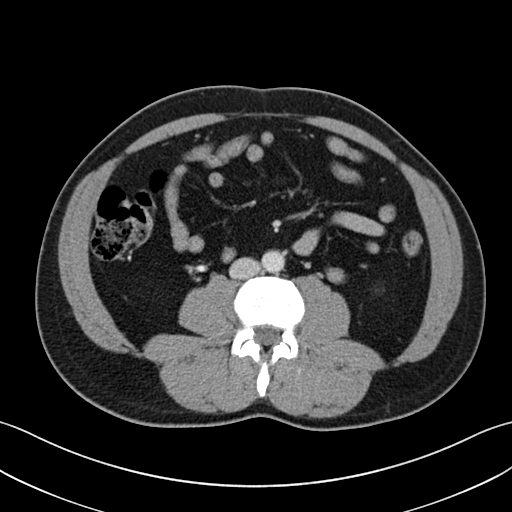
[im 56/100  soft-tissue]
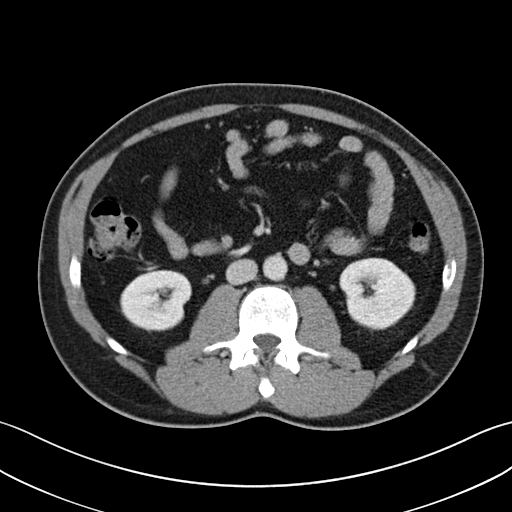
[im 62/100  soft-tissue]
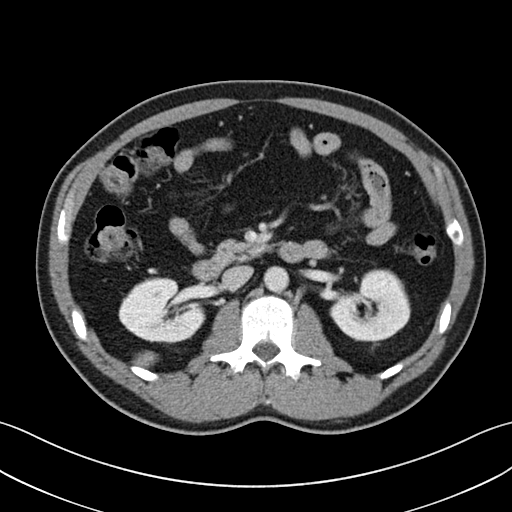
[im 62/100  bone]
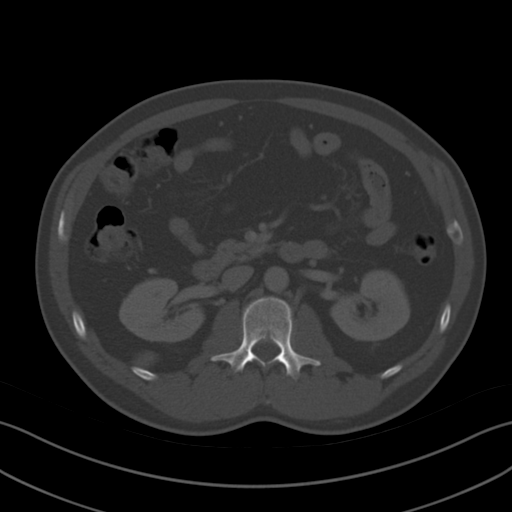
[im 69/100  soft-tissue]
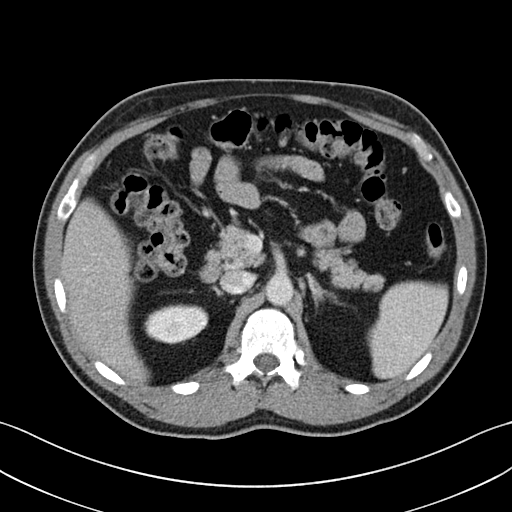
[im 81/100  soft-tissue]
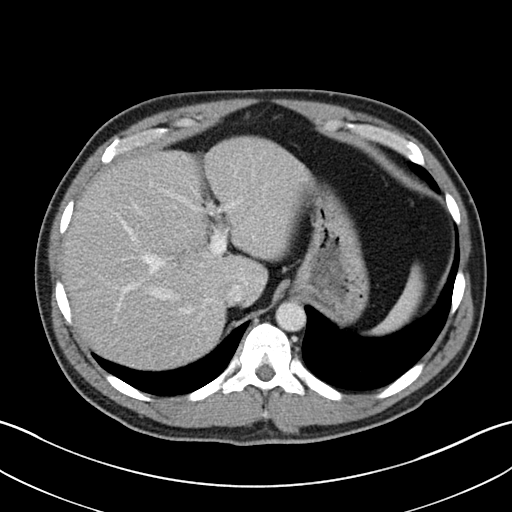
[im 87/100  soft-tissue]
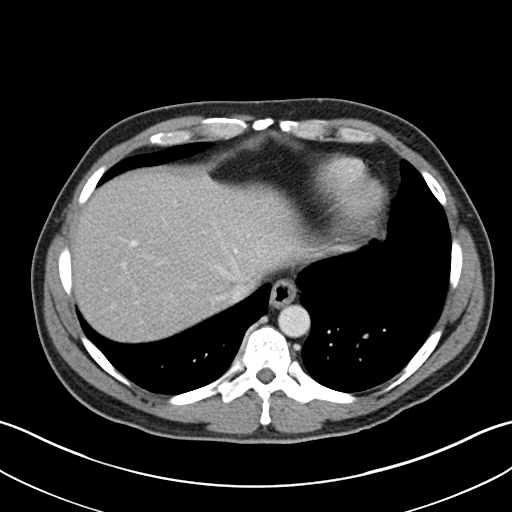
[im 93/100  soft-tissue]
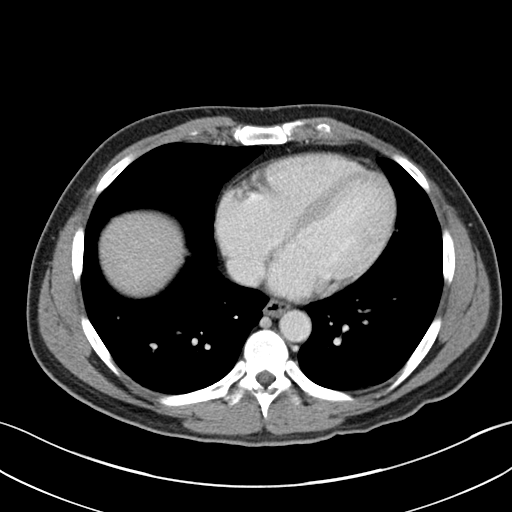

[Series 5: coronal st · coronal · 0.72mm/px · 3 of 92 slices shown]
[im 31/92  soft-tissue]
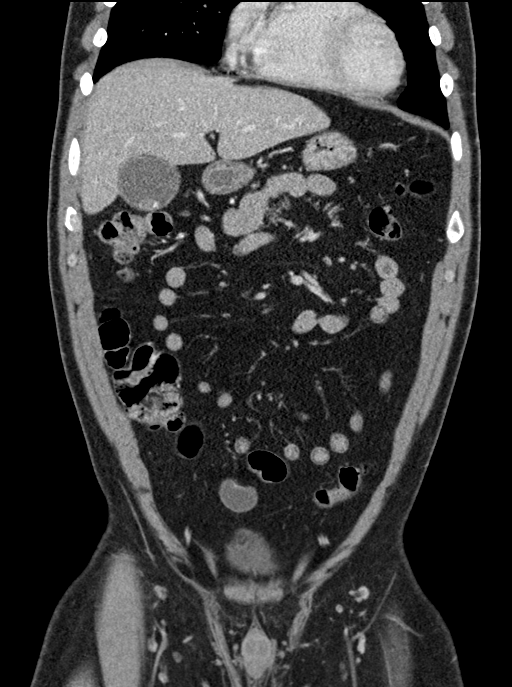
[im 41/92  soft-tissue]
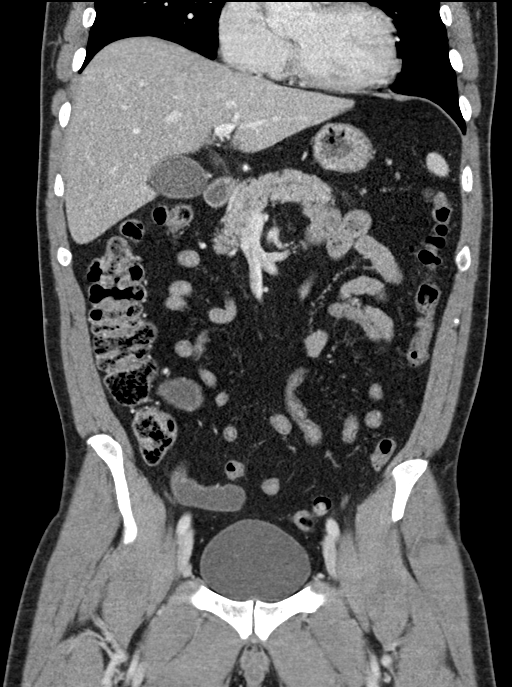
[im 51/92  soft-tissue]
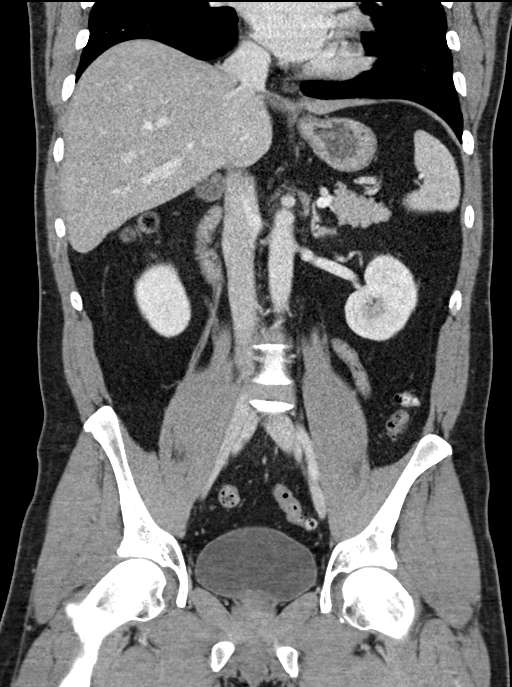

[16 of 46 positions shown; findings below may reference images not displayed]

FINDINGS: Lower chest: No acute abnormality.

Hepatobiliary: No focal hepatic abnormality. Small stones in the
gallbladder. No biliary dilatation

Pancreas: Unremarkable. No pancreatic ductal dilatation or
surrounding inflammatory changes.

Spleen: Normal in size without focal abnormality.

Adrenals/Urinary Tract: Adrenal glands are unremarkable. Kidneys are
normal, without renal calculi, focal lesion, or hydronephrosis.
Bladder is unremarkable.

Stomach/Bowel: Stomach is within normal limits. Appendix appears
normal. No evidence of bowel wall thickening, distention, or
inflammatory changes.

Vascular/Lymphatic: No significant vascular findings are present. No
enlarged abdominal or pelvic lymph nodes.

Reproductive: Prostate is unremarkable.

Other: No abdominal wall hernia or abnormality. No abdominopelvic
ascites.

Musculoskeletal: No acute or significant osseous findings.
IMPRESSION: 1. No definite CT evidence for acute intra-abdominal or pelvic
abnormality.
2. Gallstones

## 2018-09-02 ENCOUNTER — Ambulatory Visit: Payer: BC Managed Care – PPO | Admitting: Podiatry

## 2018-09-02 ENCOUNTER — Other Ambulatory Visit: Payer: Self-pay

## 2018-09-02 DIAGNOSIS — M9262 Juvenile osteochondrosis of tarsus, left ankle: Secondary | ICD-10-CM

## 2018-09-02 DIAGNOSIS — M7662 Achilles tendinitis, left leg: Secondary | ICD-10-CM

## 2018-09-02 DIAGNOSIS — M9261 Juvenile osteochondrosis of tarsus, right ankle: Secondary | ICD-10-CM | POA: Diagnosis not present

## 2018-09-03 ENCOUNTER — Ambulatory Visit: Payer: BC Managed Care – PPO

## 2018-09-03 DIAGNOSIS — M7662 Achilles tendinitis, left leg: Secondary | ICD-10-CM

## 2018-09-03 DIAGNOSIS — M9261 Juvenile osteochondrosis of tarsus, right ankle: Secondary | ICD-10-CM

## 2018-09-03 DIAGNOSIS — M9262 Juvenile osteochondrosis of tarsus, left ankle: Secondary | ICD-10-CM

## 2018-09-08 IMAGING — RF DG CHOLANGIOGRAM OPERATIVE
1 series · 2 of 2 positions shown · non-contrast
Comparison: Right upper quadrant ultrasound on 10/20/2017

CLINICAL DATA: Cholecystectomy for symptomatic cholelithiasis.

EXAM:
INTRAOPERATIVE CHOLANGIOGRAM
TECHNIQUE: Cholangiographic images from the C-arm fluoroscopic device were
submitted for interpretation post-operatively. Please see the
procedural report for the amount of contrast and the fluoroscopy
time utilized.

[Series 1: run · 2 of 2 slices shown]
[im 1/2]
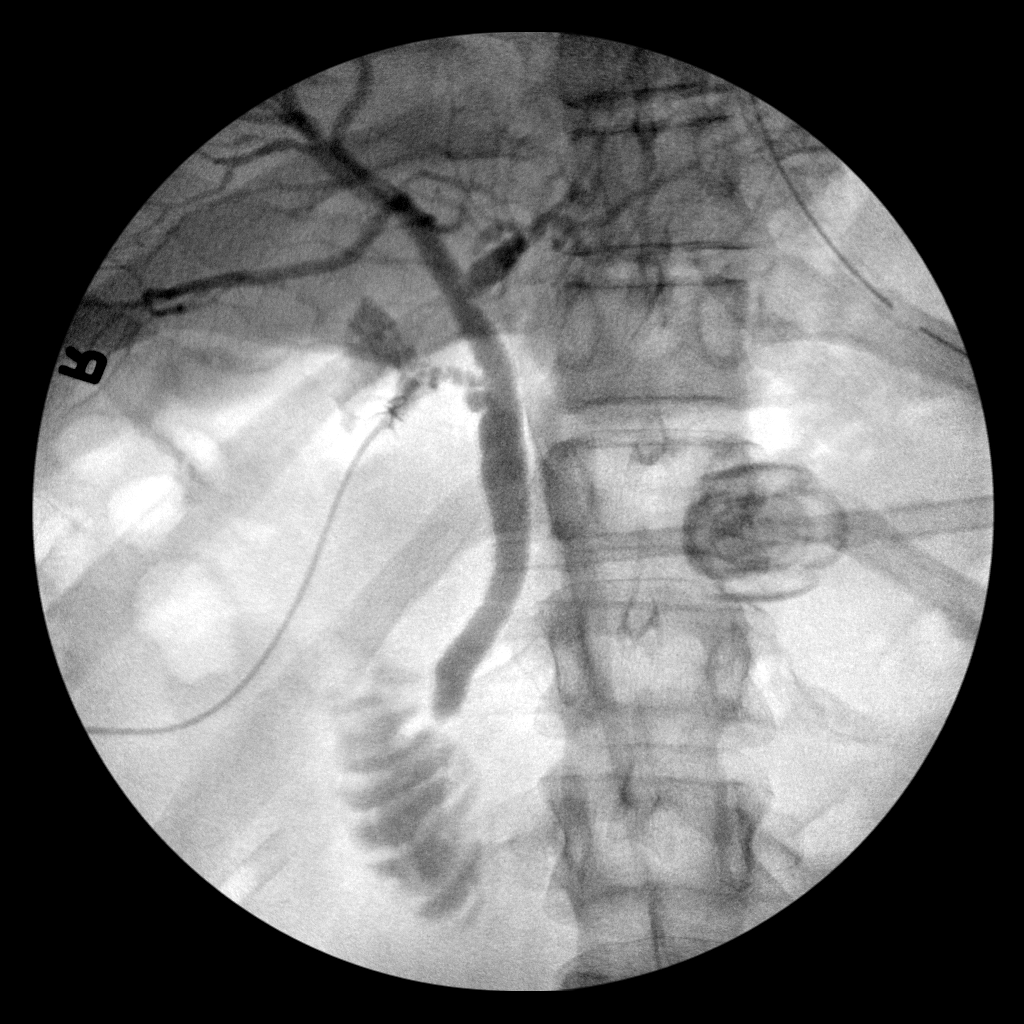
[im 2/2]
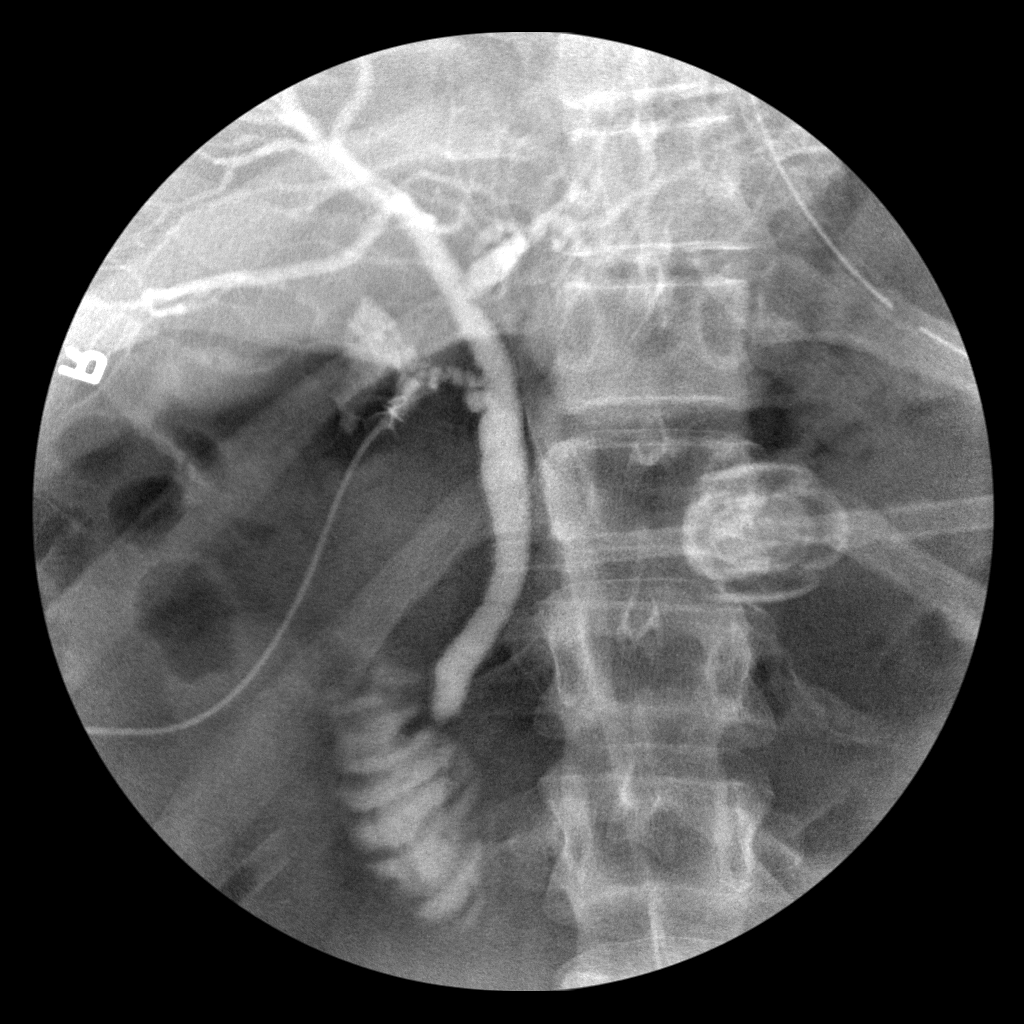

[2 of 2 positions shown; findings below may reference images not displayed]

FINDINGS: Intraoperative imaging with a C-arm demonstrates normal opacified
bile ducts without evidence of obstruction or filling defect.
Contrast enters the duodenum. No contrast extravasation identified.
IMPRESSION: Normal intraoperative cholangiogram.

## 2018-09-09 NOTE — Progress Notes (Signed)
Patient is here today for his third treatment.  Treatment includes shockwave therapy to the level left posterior heel.  Patient only recently diagnosed with Achilles tendinitis.  He states that the pain was worse this week, but he did foot paddle his kayak for 80 miles over the weekend   ESWT administered to posterior bilateral heel at 5 J.  Calf was also treated.  He is to avoid NSAIDs and ice, I did advised him to take it easy on resuming regular activities until treatment is completed.  He is to follow-up in 1  week for his 10th treatment.

## 2018-09-09 NOTE — Progress Notes (Signed)
   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 He states he feels the EPAT treatments have helped him significantly. There are no worsening factors noted. He denies any new complaints at this time. Patient is here for further evaluation and treatment.   Past Medical History:  Diagnosis Date  . Gallstones   . Medical history non-contributory       Physical Exam: General: The patient is alert and oriented x3 in no acute distress.  Dermatology: Skin is warm, dry and supple bilateral lower extremities. Negative for open lesions or macerations.  Vascular: Palpable pedal pulses bilaterally. No edema or erythema noted. Capillary refill within normal limits.  Neurological: Epicritic and protective threshold grossly intact bilaterally.   Musculoskeletal Exam: Pain on palpation noted to the posterior tubercle of the left calcaneus at the insertion of the Achilles tendon consistent with retrocalcaneal bursitis. 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 Range of motion within normal limits. Muscle strength 5/5 in all muscle groups bilateral lower extremities.  Assessment: 1. Insertional Achilles tendinitis left - improved  2. 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 Patient was evaluated.  2. Appointment with Janett Billow, RN for EPAT for two weeks.  3. Return to clinic as needed.   Scientist, forensic.    Edrick Kins, DPM [facility]  Dr. Edrick Kins, [place]                                        [place], [place] [address]                Office [phone]  Fax [phone]

## 2018-09-10 ENCOUNTER — Other Ambulatory Visit: Payer: Self-pay

## 2018-09-10 ENCOUNTER — Ambulatory Visit: Payer: BC Managed Care – PPO

## 2018-09-10 DIAGNOSIS — M9262 Juvenile osteochondrosis of tarsus, left ankle: Secondary | ICD-10-CM

## 2018-09-10 DIAGNOSIS — M7662 Achilles tendinitis, left leg: Secondary | ICD-10-CM

## 2018-09-10 DIAGNOSIS — M9261 Juvenile osteochondrosis of tarsus, right ankle: Secondary | ICD-10-CM

## 2018-09-11 NOTE — Progress Notes (Signed)
Patient is here today for his third treatment.  Treatment includes shockwave therapy to the level left posterior heel.  Patient only recently diagnosed with Achilles tendinitis.  He states that the pain was worse this week, but he did foot paddle his kayak for 80 miles over the weekend   ESWT administered to posterior bilateral heel at 5 J.  Calf was also treated.  He is to avoid NSAIDs and ice, I did advised him to take it easy on resuming regular activities until treatment is completed.  He is to follow-up in with Dr Amalia Hailey in 2-3 weeks for re-evaluation

## 2018-10-02 ENCOUNTER — Ambulatory Visit: Payer: BC Managed Care – PPO | Admitting: Podiatry

## 2018-10-02 ENCOUNTER — Other Ambulatory Visit: Payer: Self-pay

## 2018-10-02 VITALS — Temp 97.6°F

## 2018-10-02 DIAGNOSIS — M7662 Achilles tendinitis, left leg: Secondary | ICD-10-CM

## 2018-10-02 DIAGNOSIS — M9261 Juvenile osteochondrosis of tarsus, right ankle: Secondary | ICD-10-CM | POA: Diagnosis not present

## 2018-10-02 DIAGNOSIS — M9262 Juvenile osteochondrosis of tarsus, left ankle: Secondary | ICD-10-CM | POA: Diagnosis not present

## 2018-10-02 DIAGNOSIS — M7732 Calcaneal spur, left foot: Secondary | ICD-10-CM | POA: Diagnosis not present

## 2018-10-02 NOTE — Patient Instructions (Signed)
Pre-Operative Instructions  Congratulations, you have decided to take an important step towards improving your quality of life.  You can be assured that the doctors and staff at [facility] will be with you every step of the way.  Here are some important things you should know:  1. Plan to be at the surgery center/hospital at least 1 (one) hour prior to your scheduled time, unless otherwise directed by the surgical center/hospital staff.  You must have a responsible adult accompany you, remain during the surgery and drive you home.  Make sure you have directions to the surgical center/hospital to ensure you arrive on time. 2. If you are having surgery at [facility] or [facility], you will need a copy of your medical history and physical form from your family physician within one month prior to the date of surgery. We will give you a form for your primary physician to complete.  3. We make every effort to accommodate the date you request for surgery.  However, there are times where surgery dates or times have to be moved.  We will contact you as soon as possible if a change in schedule is required.   4. No aspirin/ibuprofen for one week before surgery.  If you are on aspirin, any non-steroidal anti-inflammatory medications (Mobic, Aleve, Ibuprofen) should not be taken seven (7) days prior to your surgery.  You make take Tylenol for pain prior to surgery.  5. Medications - If you are taking daily heart and blood pressure medications, seizure, reflux, allergy, asthma, anxiety, pain or diabetes medications, make sure you notify the surgery center/hospital before the day of surgery so they can tell you which medications you should take or avoid the day of surgery. 6. No food or drink after midnight the night before surgery unless directed otherwise by surgical center/hospital staff. 7. No alcoholic beverages 24-hours prior to surgery.  No smoking 24-hours prior or 24-hours after  surgery. 8. Wear loose pants or shorts. They should be loose enough to fit over bandages, boots, and casts. 9. Don't wear slip-on shoes. Sneakers are preferred. 10. Bring your boot with you to the surgery center/hospital.  Also bring crutches or a walker if your physician has prescribed it for you.  If you do not have this equipment, it will be provided for you after surgery. 11. If you have not been contacted by the surgery center/hospital by the day before your surgery, call to confirm the date and time of your surgery. 12. Leave-time from work may vary depending on the type of surgery you have.  Appropriate arrangements should be made prior to surgery with your employer. 13. Prescriptions will be provided immediately following surgery by your doctor.  Fill these as soon as possible after surgery and take the medication as directed. Pain medications will not be refilled on weekends and must be approved by the doctor. 14. Remove nail polish on the operative foot and avoid getting pedicures prior to surgery. 15. Wash the night before surgery.  The night before surgery wash the foot and leg well with water and the antibacterial soap provided. Be sure to pay special attention to beneath the toenails and in between the toes.  Wash for at least three (3) minutes. Rinse thoroughly with water and dry well with a towel.  Perform this wash unless told not to do so by your physician.  Enclosed: 1 Ice pack (please put in freezer the night before surgery)   1 Hibiclens skin cleaner     Pre-op instructions  If you have any questions regarding the instructions, please do not hesitate to call our office.  [place]: [address], [place], [place] [address] -- [phone]  [place]: [address] [place], [place] [address] -- [phone]  [place]: [address]  [place], [place] [address] -- [phone]  [place]: [address], [address], [place], [place] [address] -- [phone]  Website:  [email or web address] 

## 2018-10-07 NOTE — Progress Notes (Signed)
   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 He reports some continued pain. He reports having completed 13 EPAT sessions and is still hurting. He has also been doing daily stretching exercises. There are no modifying factors noted. Patient is here for further evaluation and treatment.   Past Medical History:  Diagnosis Date  . Gallstones   . Medical history non-contributory       Physical Exam: General: The patient is alert and oriented x3 in no acute distress.  Dermatology: Skin is warm, dry and supple bilateral lower extremities. Negative for open lesions or macerations.  Vascular: Palpable pedal pulses bilaterally. No edema or erythema noted. Capillary refill within normal limits.  Neurological: Epicritic and protective threshold grossly intact bilaterally.   Musculoskeletal Exam: Pain on palpation noted to the posterior tubercle of the left calcaneus at the insertion of the Achilles tendon consistent with retrocalcaneal bursitis. 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 Range of motion within normal limits. Muscle strength 5/5 in all muscle groups bilateral lower extremities.  Assessment: 1. Insertional Achilles tendinitis left - improved  2. 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 Patient was evaluated.  2. Today we discussed the conservative versus surgical management of the presenting pathology. The patient opts for surgical management. All possible complications and details of the procedure were explained. All patient questions were answered. No guarantees were expressed or implied. 3. Authorization for surgery was initiated today. Surgery will consist of retrocalcaneal exostectomy with repair of Achilles tendon left.  4. Continue doing daily stretching exercises.  5. Return to clinic one week post op.   Scientist, forensic.    Edrick Kins, DPM [facility]  Dr. Edrick Kins, [place]                                        [place], [place] [address]                Office [phone]  Fax [phone]

## 2018-11-15 ENCOUNTER — Encounter: Payer: Self-pay | Admitting: Podiatry

## 2019-02-18 ENCOUNTER — Telehealth: Payer: Self-pay | Admitting: *Deleted

## 2019-02-18 NOTE — Telephone Encounter (Signed)
DOS 02/27/2019 REPAIR ACHILLES - 27650 AND CALCANEAL OSTECTOMY - 46659 LEFT FOOT  BCBS: Eligibility Date - 03/27/2018 - 03/26/9998   In-Network    Max Per Benefit Period Year-to-Date Remaining  CoInsurance  30%   Deductible  $1500.00 $1295.16  Out-Of-Pocket  $5900.00 $9357.01   AMBULATORY SURGERY  In Network  [employer] Coinsurance  Not Applicable  77% per [place]

## 2019-02-27 ENCOUNTER — Telehealth: Payer: Self-pay | Admitting: *Deleted

## 2019-02-27 ENCOUNTER — Other Ambulatory Visit: Payer: Self-pay | Admitting: Podiatry

## 2019-02-27 DIAGNOSIS — M66872 Spontaneous rupture of other tendons, left ankle and foot: Secondary | ICD-10-CM

## 2019-02-27 DIAGNOSIS — M7732 Calcaneal spur, left foot: Secondary | ICD-10-CM

## 2019-02-27 MED ORDER — OXYCODONE-ACETAMINOPHEN 5-325 MG PO TABS: 1.0000 | ORAL_TABLET | Freq: Four times a day (QID) | ORAL | 0 refills | Status: AC | PRN

## 2019-02-27 NOTE — Telephone Encounter (Signed)
Pt presents to office for surgical cast check to left lower extremity. I tested pt's left 1-5 toes are without swelling, they were all warm to touch, with quick capillary refill to all left 1-5 toes. Pt states he is not able to move the toes and the he has no sensation to the lower left leg, because he was told he had a full block that would last about 18 hours. I told pt the reason he could not move the toes at this time is the sensation to move has been decrease. I told pt his circulation testing was normal, but if the temperature became either too hot or cold, color of the toes changed to either lighter or darker than current, or if calf pain/cramping to call.

## 2019-02-27 NOTE — Telephone Encounter (Signed)
I spoke with Dr. Amalia Hailey and he states pt may not have normal sensation to the leg in the cast yet, perform circulation test to toes and evaluate the swelling and if needed split cast, but probably needs to keep foot elevated.

## 2019-02-27 NOTE — Progress Notes (Signed)
PRN postop 

## 2019-02-27 NOTE — Telephone Encounter (Signed)
Pt's wife, Ailene Ravel states earlier after the surgery he was able to wiggle his toes on request and feel them, but after his nap he can not wiggle his toes and he feels his cast is too tight. I told Ailene Ravel to bring pt in for possible cast release.

## 2019-03-05 ENCOUNTER — Ambulatory Visit: Payer: BC Managed Care – PPO

## 2019-03-05 ENCOUNTER — Ambulatory Visit (INDEPENDENT_AMBULATORY_CARE_PROVIDER_SITE_OTHER): Payer: BC Managed Care – PPO | Admitting: Podiatry

## 2019-03-05 ENCOUNTER — Other Ambulatory Visit: Payer: Self-pay

## 2019-03-05 DIAGNOSIS — Z9889 Other specified postprocedural states: Secondary | ICD-10-CM

## 2019-03-08 NOTE — Progress Notes (Signed)
   Subjective:  Patient presents today status post retrocalcaneal exostectomy with repair Achilles left. DOS: 02/27/2019. He states he is doing well. He denies any pain or modifying factors. He has been nonweightbearing as directed. He denies any complaints or concerns at this time. Patient is here for further evaluation and treatment.    Past Medical History:  Diagnosis Date  . Gallstones   . Medical history non-contributory       Objective/Physical Exam Neurovascular status intact. Cast left intact.   Assessment: 1. s/p retrocalcaneal exostectomy with repair Achilles left. DOS: 02/27/2019   Plan of Care:  1. Patient was evaluated. 2. Cast left intact.  3. Continue nonweightbearing with crutches.  4. Return to clinic in one week for cast removal.   Lutheran preacher.    Edrick Kins, DPM [facility]  Dr. Edrick Kins, [place]                                        [place], [place] [address]                Office [phone]  Fax [phone]

## 2019-03-12 ENCOUNTER — Ambulatory Visit (INDEPENDENT_AMBULATORY_CARE_PROVIDER_SITE_OTHER): Payer: BC Managed Care – PPO | Admitting: Podiatry

## 2019-03-12 ENCOUNTER — Other Ambulatory Visit: Payer: Self-pay

## 2019-03-12 ENCOUNTER — Ambulatory Visit (INDEPENDENT_AMBULATORY_CARE_PROVIDER_SITE_OTHER): Payer: BC Managed Care – PPO

## 2019-03-12 DIAGNOSIS — M9262 Juvenile osteochondrosis of tarsus, left ankle: Secondary | ICD-10-CM | POA: Diagnosis not present

## 2019-03-12 DIAGNOSIS — Z9889 Other specified postprocedural states: Secondary | ICD-10-CM

## 2019-03-12 DIAGNOSIS — M66872 Spontaneous rupture of other tendons, left ankle and foot: Secondary | ICD-10-CM | POA: Diagnosis not present

## 2019-03-12 DIAGNOSIS — M9261 Juvenile osteochondrosis of tarsus, right ankle: Secondary | ICD-10-CM

## 2019-03-16 NOTE — Progress Notes (Signed)
   Subjective:  Patient presents today status post retrocalcaneal exostectomy with repair Achilles left. DOS: 02/27/2019. He states he is doing well. He denies any pain or modifying factors. He has been taking Ibuprofen every 8 hours for swelling. Patient is here for further evaluation and treatment.    Past Medical History:  Diagnosis Date  . Gallstones   . Medical history non-contributory       Objective/Physical Exam Neurovascular status intact.  Skin incisions appear to be well coapted with sutures and staples intact. No sign of infectious process noted. No dehiscence. No active bleeding noted. Moderate edema noted to the surgical extremity.  Radiographic Exam:  Osteotomies sites appear to be stable with routine healing.   Assessment: 1. s/p retrocalcaneal exostectomy with repair Achilles left. DOS: 02/27/2019   Plan of Care:  1. Patient was evaluated. X-Rays reviewed.  2. Cast removed. Dry sterile dressing applied.  3. CAM boot dispensed. Continue nonweightbearing. 4. Return to clinic in 2 weeks.   Scientist, forensic.    Edrick Kins, DPM [facility]  Dr. Edrick Kins, [place]                                        [place], [place] [address]                Office [phone]  Fax [phone]

## 2019-03-19 ENCOUNTER — Encounter: Payer: Self-pay | Admitting: Podiatry

## 2019-03-26 ENCOUNTER — Encounter: Payer: BC Managed Care – PPO | Admitting: Podiatry

## 2019-03-31 ENCOUNTER — Other Ambulatory Visit: Payer: Self-pay

## 2019-03-31 ENCOUNTER — Ambulatory Visit (INDEPENDENT_AMBULATORY_CARE_PROVIDER_SITE_OTHER): Payer: BC Managed Care – PPO | Admitting: Podiatry

## 2019-03-31 DIAGNOSIS — Z9889 Other specified postprocedural states: Secondary | ICD-10-CM

## 2019-04-03 NOTE — Progress Notes (Signed)
   Subjective:  Patient presents today status post retrocalcaneal exostectomy with repair Achilles left. DOS: 02/27/2019. He states he is doing well. He reports some minor swelling. He denies any pain or modifying factors and states he has not had to take any pain medication. He has been using the CAM boot as directed. Patient is here for further evaluation and treatment.    Past Medical History:  Diagnosis Date  . Gallstones   . Medical history non-contributory       Objective/Physical Exam Neurovascular status intact.  Skin incisions appear to be well coapted with sutures and staples intact. No sign of infectious process noted. No dehiscence. No active bleeding noted. Moderate edema noted to the surgical extremity.   Assessment: 1. s/p retrocalcaneal exostectomy with repair Achilles left. DOS: 02/27/2019   Plan of Care:  1. Patient was evaluated.  2. Sutures removed. Ace wrap placed.  3. Begin weightbearing in CAM boot.  4. Order for physical therapy placed at [facility].  5. Return to clinic in 6 weeks.   Teacher, music.    Felecia Shelling, DPM [facility]  Dr. Felecia Shelling, DPM    [address]                                        [place], [place] [address]                Office [phone]  Fax [phone]

## 2019-04-24 ENCOUNTER — Telehealth: Payer: Self-pay | Admitting: Podiatry

## 2019-04-24 NOTE — Telephone Encounter (Signed)
Pt calling concerned about some mild heel pain he is experiencing. Pt had surgery on 02/27/19 and was having no pain in his foot until this week when he was trying to swing a golf club. Pt says the pain in very mild but is concerned only because he was having zero pain before then. Pt hoping that he has just over worked the foot and hasn't done any damage.  Pt was offered an appt in [place] this Friday or [place] on Monday but did not want to schedule.    Pt would like a call back to discuss.

## 2019-04-24 NOTE — Telephone Encounter (Signed)
Left message for pt to go back into his stabilizing boot and rest the area, golfing may be added to his activities later in his recovery, I would send his message to Dr. Logan Bores, but wanted to him to stabilize the foot at this time.

## 2019-05-12 ENCOUNTER — Other Ambulatory Visit: Payer: Self-pay

## 2019-05-12 ENCOUNTER — Ambulatory Visit (INDEPENDENT_AMBULATORY_CARE_PROVIDER_SITE_OTHER): Payer: BC Managed Care – PPO

## 2019-05-12 ENCOUNTER — Ambulatory Visit (INDEPENDENT_AMBULATORY_CARE_PROVIDER_SITE_OTHER): Payer: BC Managed Care – PPO | Admitting: Podiatry

## 2019-05-12 DIAGNOSIS — Z9889 Other specified postprocedural states: Secondary | ICD-10-CM

## 2019-05-14 NOTE — Progress Notes (Signed)
   Subjective:  Patient presents today status post retrocalcaneal exostectomy with repair Achilles left. DOS: 02/27/2019. He states he is doing well. He denies any pain or worsening factors. He has been doing physical therapy twice weekly which has helped alleviate his symptoms. He reports only minor swelling. Patient is here for further evaluation and treatment.    Past Medical History:  Diagnosis Date  . Gallstones   . Medical history non-contributory       Objective/Physical Exam Neurovascular status intact.  Skin incisions appear to be well coapted. No sign of infectious process noted. No dehiscence. No active bleeding noted. Moderate edema noted to the surgical extremity.  Radiographic Exam:  Orthopedic hardware and osteotomies sites appear to be stable with routine healing.  Assessment: 1. s/p retrocalcaneal exostectomy with repair Achilles left. DOS: 02/27/2019   Plan of Care:  1. Patient was evaluated. X-Rays reviewed.  2. Discontinue using CAM boot.  3. Recommended good shoe gear.  4. Continue physical therapy twice weekly.  5. Return to clinic in 4 weeks for final follow up visit.    Teacher, music.    Felecia Shelling, DPM [facility]  Dr. Felecia Shelling, DPM    [address]                                        [place], [place] [address]                Office [phone]  Fax [phone]

## 2019-05-30 ENCOUNTER — Telehealth: Payer: Self-pay | Admitting: Podiatry

## 2019-05-30 NOTE — Telephone Encounter (Signed)
Yes, he's okay to play golf on 3/9. Thanks, Dr. Logan Bores

## 2019-05-30 NOTE — Telephone Encounter (Signed)
Pt has appt 06-09-19 for final post op (DOS 02/27/2019 REPAIR ACHILLES AND CALCANEAL OSTECTOMY LT) but wants to know if you are ok with him playing golf on 06-03-19.  States he is doing great with no pain & is doing physical therapy.

## 2019-06-09 ENCOUNTER — Ambulatory Visit (INDEPENDENT_AMBULATORY_CARE_PROVIDER_SITE_OTHER): Payer: BC Managed Care – PPO

## 2019-06-09 ENCOUNTER — Other Ambulatory Visit: Payer: Self-pay

## 2019-06-09 ENCOUNTER — Ambulatory Visit (INDEPENDENT_AMBULATORY_CARE_PROVIDER_SITE_OTHER): Payer: BC Managed Care – PPO | Admitting: Podiatry

## 2019-06-09 DIAGNOSIS — Z9889 Other specified postprocedural states: Secondary | ICD-10-CM

## 2019-06-09 DIAGNOSIS — M66872 Spontaneous rupture of other tendons, left ankle and foot: Secondary | ICD-10-CM

## 2019-06-11 NOTE — Progress Notes (Signed)
   Subjective:  Patient presents today status post retrocalcaneal exostectomy with repair Achilles left. DOS: 02/27/2019. He reports a pins and needles sensation to the posterior left heel and leg. He reports associated swelling. He has been doing physical therapy and wearing regular shoes which he states both help alleviate his symptoms. He has also been using the compression anklet as directed. Patient is here for further evaluation and treatment.    Past Medical History:  Diagnosis Date  . Gallstones   . Medical history non-contributory       Objective/Physical Exam Neurovascular status intact.  Skin incisions appear to be well coapted. No sign of infectious process noted. No dehiscence. No active bleeding noted. Moderate edema noted to the surgical extremity.  Radiographic Exam:  Orthopedic hardware and osteotomies sites appear to be stable with routine healing.  Assessment: 1. s/p retrocalcaneal exostectomy with repair Achilles left. DOS: 02/27/2019   Plan of Care:  1. Patient was evaluated. X-Rays reviewed.  2. May resume full activity with no restrictions.  3. Continue using compression anklet.  4. Recommended good shoe gear.  5. Return to clinic as needed.     Teacher, music.    Felecia Shelling, DPM [facility]  Dr. Felecia Shelling, DPM    [address]                                        [place], [place] [address]                Office [phone]  Fax [phone]

## 2019-09-24 ENCOUNTER — Ambulatory Visit: Payer: BC Managed Care – PPO | Admitting: Podiatry

## 2019-09-24 ENCOUNTER — Other Ambulatory Visit: Payer: Self-pay

## 2019-09-24 DIAGNOSIS — Z9889 Other specified postprocedural states: Secondary | ICD-10-CM | POA: Diagnosis not present

## 2019-09-24 DIAGNOSIS — M9262 Juvenile osteochondrosis of tarsus, left ankle: Secondary | ICD-10-CM | POA: Diagnosis not present

## 2019-09-24 DIAGNOSIS — M9261 Juvenile osteochondrosis of tarsus, right ankle: Secondary | ICD-10-CM | POA: Diagnosis not present

## 2019-09-24 DIAGNOSIS — M7662 Achilles tendinitis, left leg: Secondary | ICD-10-CM | POA: Diagnosis not present

## 2019-09-24 NOTE — Progress Notes (Signed)
   Subjective:  Patient presents today status post retrocalcaneal exostectomy with repair Achilles left. DOS: 02/27/2019.  Patient states that with activity he does get some achiness to the medial lateral aspects of the heel.  He has been full activity with no restrictions and he does have some pain that comes with periods of long walking or standing or physical exertion.  He has been doing physical therapy and wearing zero drop Altra shoes.  Patient states that when he wears his Altra shoes he actually has no symptoms or pain.  He presents today for further treatment and evaluation  Past Medical History:  Diagnosis Date  . Gallstones   . Medical history non-contributory       Objective/Physical Exam Neurovascular status intact.  Skin incisions appear to be well coapted and healed.  There is some tenderness to palpation to the medial lateral aspect of the left calcaneal posterior tubercle on either side of the insertion of the Achilles tendon. There is also 2 palpable deep sutures, likely from the tendon anchor to the central portion of the insertion of the Achilles tendon.  The suture anchors today are not tender with palpation.  Assessment: 1. s/p retrocalcaneal exostectomy with repair Achilles left. DOS: 02/27/2019   Plan of Care:  1. Patient was evaluated.   2.  Continue full activity with no restrictions 3.  I explained the patient that Achilles tendon repair can take up to a year to gain full activity and full strength.  Continue daily stretching exercises and wearing good supportive shoes 4.  Patient states that he does not take any oral medication.  He declined anti-inflammatory medication today.   5.  Return to clinic as needed  [employer] .    Felecia Shelling, DPM [facility]  Dr. Felecia Shelling, DPM    [address]                                        [place], [place] [address]                Office [phone]  Fax [phone]

## 2019-12-29 ENCOUNTER — Ambulatory Visit (INDEPENDENT_AMBULATORY_CARE_PROVIDER_SITE_OTHER): Payer: BC Managed Care – PPO | Admitting: Podiatry

## 2019-12-29 ENCOUNTER — Other Ambulatory Visit: Payer: Self-pay

## 2019-12-29 DIAGNOSIS — M7662 Achilles tendinitis, left leg: Secondary | ICD-10-CM

## 2019-12-29 DIAGNOSIS — Z9889 Other specified postprocedural states: Secondary | ICD-10-CM | POA: Diagnosis not present

## 2019-12-29 DIAGNOSIS — T8189XA Other complications of procedures, not elsewhere classified, initial encounter: Secondary | ICD-10-CM | POA: Diagnosis not present

## 2020-01-04 NOTE — Progress Notes (Signed)
   Subjective:  Patient presents today status post retrocalcaneal exostectomy with repair Achilles left. DOS: 02/27/2019.  Patient states that he is doing very well and has been full activity with no restrictions.  He has good strength and balance on his surgical extremity.  His main complaint today is associated to the sutures.  There are 2 nonabsorbable sutures to the posterior aspect of the heel that are palpable and this is causing irritation and discomfort especially in close toed shoes.  He presents for further treatment and evaluation  Past Medical History:  Diagnosis Date  . Gallstones   . Medical history non-contributory      Objective: Physical Exam General: The patient is alert and oriented x3 in no acute distress.  Dermatology: Skin is cool, dry and supple bilateral lower extremities. Negative for open lesions or macerations.  Vascular: Palpable pedal pulses bilaterally. No edema or erythema noted. Capillary refill within normal limits.  Neurological: Epicritic and protective threshold grossly intact bilaterally.   Musculoskeletal Exam: All pedal and ankle joints range of motion within normal limits bilateral. Muscle strength 5/5 in all groups bilateral. There is also 2 palpable deep sutures, likely from the tendon anchor to the central portion of the insertion of the Achilles tendon.  There is also some associated tenderness with light touch of the sutures  Assessment: 1. s/p retrocalcaneal exostectomy with repair Achilles left. DOS: 02/27/2019 2.  Palpable irritating sutures left posterior heel x2   Plan of Care:  1. Patient was evaluated.   2.  Today we discussed the option of removing the sutures.  I do believe this would be in the patient's best interest as I do not feel that the sutures will resolve on their own.  They are nonabsorbable and very superficial and palpable just underneath the skin.  The patient has full strength and completely healed from surgery. 3.   Discussed the treatment options of removing the sutures in the office versus at the surgery center.  Patient would like to have the sutures removed here in the office without general anesthesia.  Explained that I could perform a local block to the area and remove the deep sutures here in the office.  The patient agrees and would like to have that performed 4. we discussed the conservative versus surgical management of the presenting pathology. The patient opts for surgical management. All possible complications and details of the procedure were explained. All patient questions were answered. No guarantees were expressed or implied. 5. Authorization for in-office surgery was initiated today. Surgery will consist of removal of deep sutures x2 left posterior heel 6.  Return to clinic morning of in office surgery procedure    Felecia Shelling, DPM [facility]  Dr. Felecia Shelling, DPM    [address]                                        [place], [place] [address]                Office [phone]  Fax [phone]

## 2020-01-05 ENCOUNTER — Ambulatory Visit (INDEPENDENT_AMBULATORY_CARE_PROVIDER_SITE_OTHER): Payer: BC Managed Care – PPO | Admitting: Podiatry

## 2020-01-05 ENCOUNTER — Other Ambulatory Visit: Payer: Self-pay

## 2020-01-05 DIAGNOSIS — T8189XA Other complications of procedures, not elsewhere classified, initial encounter: Secondary | ICD-10-CM | POA: Diagnosis not present

## 2020-01-05 MED ORDER — DOXYCYCLINE HYCLATE 100 MG PO TABS: 100.0000 mg | ORAL_TABLET | Freq: Two times a day (BID) | ORAL | 0 refills | Status: AC

## 2020-01-05 NOTE — Progress Notes (Signed)
   OPERATIVE REPORT Patient name: Steven Miles MRN: [id number] DOB: 09-Oct-1970  DOS:  01/05/20  Preop Dx: Reaction to deep tendon anchor sutures left heel Postop Dx: same  Procedure:  1.  Removal of deep tendon anchor sutures left heel x2  Surgeon: Felecia Shelling DPM  Anesthesia: 2 mL of 2% lidocaine with epinephrine infiltrated the posterior heel left  Hemostasis: None  EBL: Minimal mL Materials: None Injectables: None Pathology: None  Condition: The patient tolerated the procedure and anesthesia well. No complications noted or reported   Justification for procedure: The patient is a 49 y.o. male who presents today for surgical correction of symptomatic deep suture anchors to the posterior aspect of the left heel.. All conservative modalities of been unsuccessful in providing any sort of satisfactory alleviation of symptoms with the patient. The patient was told benefits as well as possible side effects of the surgery. The patient consented for surgical correction. The patient consent form was reviewed. All patient questions were answered. No guarantees were expressed or implied.   Procedure in Detail: The patient was brought to the procedure room, placed in the procedure chair in a prone position at which time an aseptic scrub and drape were performed about the patient's respective lower extremity after anesthesia was induced as described above. Attention was then directed to the surgical area where procedure number one commenced.  Procedure #1: Removal deep sutures x2 left heel 2 separate small skin incisions were planned and made overlying the palpable sutures to the left heel x2.  Incisions were both approximately 5 mm in length.  The deep suture was identified within the deeper tendon and subcutaneous tissue and was removed in toto.  Irrigation was utilized and primary closure was obtained using 4-0 nylon suture.  Dry sterile compressive dressings were then applied to all  previously mentioned incision sites about the patient's lower extremity. The tourniquet which was used for hemostasis was deflated. All normal neurovascular responses including pink color and warmth returned all the digits of patient's lower extremity.  The patient was then discharged from the office with adequate prescriptions for analgesia. Verbal as well as written instructions were provided for the patient regarding wound care. The patient is to keep the dressings clean dry and intact until they are to follow surgeon Dr. Gala Lewandowsky in the office upon discharge in one week.   Felecia Shelling, DPM [facility]  Dr. Felecia Shelling, DPM    [address]                                        [place], [place] [address]                Office [phone]  Fax [phone]

## 2020-01-12 ENCOUNTER — Ambulatory Visit (INDEPENDENT_AMBULATORY_CARE_PROVIDER_SITE_OTHER): Payer: BC Managed Care – PPO | Admitting: Podiatry

## 2020-01-12 ENCOUNTER — Other Ambulatory Visit: Payer: Self-pay

## 2020-01-12 DIAGNOSIS — T8189XA Other complications of procedures, not elsewhere classified, initial encounter: Secondary | ICD-10-CM

## 2020-01-12 DIAGNOSIS — Z9889 Other specified postprocedural states: Secondary | ICD-10-CM

## 2020-01-12 NOTE — Progress Notes (Signed)
   Subjective:  Patient presents today status post suture removal left posterior heel x2. DOS: 01/05/2020 performed here in the office.  Patient states is doing very well.  He has been weightbearing in the cam boot as instructed.  No new complaints at this time  Past Medical History:  Diagnosis Date  . Gallstones   . Medical history non-contributory       Objective/Physical Exam Neurovascular status intact.  Skin incisions appear to be well coapted with sutures  intact. No sign of infectious process noted. No dehiscence. No active bleeding noted.  Negative for any edema noted to the surgical extremity.   Assessment: 1. s/p removal of deep sutures left posterior heel. DOS: 01/05/2020 performed in office   Plan of Care:  1. Patient was evaluated.  2.  Patient is doing very well today.  Recommend antibiotic ointment and a Band-Aid daily to the posterior heel 3.  Patient may discontinue the cam boot.  Recommend good supportive sneakers that do not irritate the posterior heel 4.  Continue reduced activity 5.  Return to clinic in 2 weeks for suture removal   Felecia Shelling, DPM [facility]  Dr. Felecia Shelling, DPM    [address]                                        [place], [place] [address]                Office [phone]  Fax [phone]

## 2020-01-26 ENCOUNTER — Ambulatory Visit (INDEPENDENT_AMBULATORY_CARE_PROVIDER_SITE_OTHER): Payer: BC Managed Care – PPO | Admitting: Podiatry

## 2020-01-26 ENCOUNTER — Other Ambulatory Visit: Payer: Self-pay

## 2020-01-26 DIAGNOSIS — Z9889 Other specified postprocedural states: Secondary | ICD-10-CM

## 2020-01-26 DIAGNOSIS — T8189XA Other complications of procedures, not elsewhere classified, initial encounter: Secondary | ICD-10-CM

## 2020-01-26 NOTE — Progress Notes (Signed)
   Subjective:  Patient presents today status post suture removal left posterior heel x2. DOS: 01/05/2020 performed here in the office.  Patient is doing very well.  He is kept Band-Aid over the incision sites daily.  No new complaints at this time.  Otherwise he is feeling very well.  Negative for pain.  Past Medical History:  Diagnosis Date  . Gallstones   . Medical history non-contributory       Objective/Physical Exam Neurovascular status intact.  Skin incisions appear to be well coapted with sutures  intact. No sign of infectious process noted. No dehiscence. No active bleeding noted.  Negative for any edema noted to the surgical extremity.   Assessment: 1. s/p removal of deep sutures left posterior heel. DOS: 01/05/2020 performed in office   Plan of Care:  1. Patient was evaluated.  2.  Sutures removed today. 3.  Continue light dressing x1 week 4.  Patient may resume full activity no restrictions in 2 weeks 5.  Return to clinic as needed   Felecia Shelling, DPM [facility]  Dr. Felecia Shelling, DPM    [address]                                        [place], [place] [address]                Office [phone]  Fax [phone]
